# Patient Record
Sex: Male | Born: 1950 | Race: White | Hispanic: No | Marital: Married | Smoking: Former smoker
Health system: Southern US, Community
[De-identification: ages and names within clinical notes are randomized; demographics above are authoritative.]

## PROBLEM LIST (undated history)

## (undated) DIAGNOSIS — F419 Anxiety disorder, unspecified: Secondary | ICD-10-CM

## (undated) DIAGNOSIS — R0600 Dyspnea, unspecified: Secondary | ICD-10-CM

## (undated) DIAGNOSIS — G4700 Insomnia, unspecified: Secondary | ICD-10-CM

## (undated) DIAGNOSIS — I350 Nonrheumatic aortic (valve) stenosis: Secondary | ICD-10-CM

## (undated) DIAGNOSIS — I10 Essential (primary) hypertension: Secondary | ICD-10-CM

## (undated) HISTORY — DX: Essential (primary) hypertension: I10

## (undated) HISTORY — PX: TONSILLECTOMY: SUR1361

---

## 2004-11-18 HISTORY — PX: SUPRAVALVULAR AORTIC STENOSIS REPAIR: SHX2475

## 2014-11-30 ENCOUNTER — Other Ambulatory Visit: Payer: Self-pay | Admitting: Physician Assistant

## 2014-11-30 DIAGNOSIS — I350 Nonrheumatic aortic (valve) stenosis: Secondary | ICD-10-CM

## 2014-12-01 ENCOUNTER — Ambulatory Visit (HOSPITAL_COMMUNITY): Payer: BLUE CROSS/BLUE SHIELD | Attending: Physician Assistant

## 2014-12-01 DIAGNOSIS — I350 Nonrheumatic aortic (valve) stenosis: Secondary | ICD-10-CM | POA: Insufficient documentation

## 2014-12-01 NOTE — Progress Notes (Signed)
2D Echo completed. 12/01/2014

## 2014-12-12 ENCOUNTER — Telehealth: Payer: Self-pay | Admitting: Interventional Cardiology

## 2014-12-12 NOTE — Telephone Encounter (Signed)
Not sure if anyone called him about his echo.  Nl LVEF.  Moderate Aortic stenosis.

## 2014-12-12 NOTE — Telephone Encounter (Signed)
Spoke with pt and informed him of his echo results.  Pt verbalized understanding. Reminded pt of appt on 1/28 and he was aware of this appt.

## 2014-12-15 ENCOUNTER — Encounter: Payer: Self-pay | Admitting: Interventional Cardiology

## 2014-12-15 ENCOUNTER — Ambulatory Visit (INDEPENDENT_AMBULATORY_CARE_PROVIDER_SITE_OTHER): Payer: BLUE CROSS/BLUE SHIELD | Admitting: Interventional Cardiology

## 2014-12-15 VITALS — BP 126/90 | HR 90 | Ht 68.0 in | Wt 163.4 lb

## 2014-12-15 DIAGNOSIS — I350 Nonrheumatic aortic (valve) stenosis: Secondary | ICD-10-CM

## 2014-12-15 DIAGNOSIS — I10 Essential (primary) hypertension: Secondary | ICD-10-CM

## 2014-12-15 DIAGNOSIS — E785 Hyperlipidemia, unspecified: Secondary | ICD-10-CM | POA: Diagnosis not present

## 2014-12-15 MED ORDER — METOPROLOL TARTRATE 25 MG PO TABS: 25.0000 mg | ORAL_TABLET | Freq: Two times a day (BID) | ORAL | Status: DC

## 2014-12-15 MED ORDER — ATORVASTATIN CALCIUM 10 MG PO TABS: 10.0000 mg | ORAL_TABLET | Freq: Every day | ORAL | Status: DC

## 2014-12-15 MED ORDER — AMLODIPINE BESYLATE 10 MG PO TABS: 10.0000 mg | ORAL_TABLET | Freq: Every day | ORAL | Status: DC

## 2014-12-15 NOTE — Patient Instructions (Addendum)
Your physician recommends that you return for lab work in: Late February (CMET, Lipids)  Your physician has requested that you have an echocardiogram in 1 year. Echocardiography is a painless test that uses sound waves to create images of your heart. It provides your doctor with information about the size and shape of your heart and how well your heart's chambers and valves are working. This procedure takes approximately one hour. There are no restrictions for this procedure.    Your physician wants you to follow-up in: 1 year with Dr. Irish Lack. You will receive a reminder letter in the mail two months in advance. If you don't receive a letter, please call our office to schedule the follow-up appointment.

## 2014-12-15 NOTE — Progress Notes (Signed)
Patient ID: Ronald Gomez, male   DOB: [birth year], 64 y.o.   MRN: [id number]     Patient ID: Ronald Gomez MRN: [id number] DOB/AGE: 06-20-1951 64 y.o.   Referring Physician No PCP; self referral   Reason for Consultation  Aortic stenosis  HPI: 64 y/o who has had moderate aortic stenosis.  He was followed in [place] with annual echos.  He would have routine blood work as well.  He has not had his lipids checked.    He continues to exercise on a regular basis. He does notice that at high levels of exertion, he may feel a reduction in exercise capacity. He used to be able to burn 500 cal and 40 minutes on a rowing machine. Now, it takes him about 45 minutes and he definitely feels very tired. He is unsure whether this is just aging or progression of his aortic stenosis. Review of his chart reveals that in 2014, he had a calculated valve area of 1.5 cm. He reports to me that in 2015, his echo showed an aortic valve area of 1.1 cm. The most recent echocardiogram done just a few weeks ago, shows a valve area of 1.1 cm.     Current Outpatient Prescriptions  Medication Sig Dispense Refill  . amLODipine (NORVASC) 10 MG tablet Take 10 mg by mouth daily.    Marland Kitchen aspirin 325 MG tablet Take 325 mg by mouth daily.    Marland Kitchen atorvastatin (LIPITOR) 10 MG tablet Take 10 mg by mouth daily.    . metoprolol tartrate (LOPRESSOR) 25 MG tablet Take 25 mg by mouth 2 (two) times daily.    . Omega-3 Fatty Acids (FISH OIL) 1000 MG CAPS Take 1,000 mg by mouth daily.    . vitamin C (ASCORBIC ACID) 500 MG tablet Take 500 mg by mouth daily.     No current facility-administered medications for this visit.   Past Medical History  Diagnosis Date  . Hypertension     Family History  Problem Relation Age of Onset  . Hypertension Mother   . Heart disease Mother   . CAD Mother   . Cardiomyopathy Mother   . Diabetes Mother   . Brain cancer Father     History   Social History  . Marital Status: Married    Spouse Name: N/A   Number of Children: N/A  . Years of Education: N/A   Occupational History  . Not on file.   Social History Main Topics  . Smoking status: Never Smoker   . Smokeless tobacco: Not on file  . Alcohol Use: 0.0 oz/week    0 Not specified per week     Comment: occasionally   . Drug Use: No  . Sexual Activity: Not on file   Other Topics Concern  . Not on file   Social History Narrative  . No narrative on file    History reviewed. No pertinent past surgical history.    (Not in a hospital admission)  Review of systems complete and found to be negative unless listed above .  No nausea, vomiting.  No fever chills, No focal weakness,  No palpitations.  Physical Exam: Filed Vitals:   12/15/14 1521  BP: 126/90  Pulse: 90    Weight: 163 lb 6.4 oz (74.118 kg)  Physical exam:  [place]/AT EOMI No JVD, No carotid bruit, normal carotid upstroke RRR X2J1 , 3/6 systolic murmur No wheezing Soft. NT, nondistended No edema. No focal motor or sensory deficits Normal affect  Labs:  No results found for: WBC, HGB, HCT, MCV, PLT No results for input(s): NA, K, CL, CO2, BUN, CREATININE, CALCIUM, PROT, BILITOT, ALKPHOS, ALT, AST, GLUCOSE in the last 168 hours.  Invalid input(s): LABALBU No results found for: CKTOTAL, CKMB, CKMBINDEX, TROPONINI No results found for: CHOL No results found for: HDL No results found for: LDLCALC No results found for: TRIG No results found for: CHOLHDL No results found for: LDLDIRECT    EKG: Normal sinus rhythm, nonspecific ST segment changes  ASSESSMENT AND PLAN:   1) aortic stenosis: This appears to be of moderate degree at this time. Plan for echocardiogram in one year. He will watch for any signs of symptomatic severe aortic stenosis.  2) hypertension: Blood pressure well controlled at home. Continue amlodipine. He did not tolerate lisinopril due to a rash and a feeling of perioral paresthesias.  This may have been some variant of angioedema. Could consider  an ARB in the future if needed. Refill blood pressure meds.  3) hyperlipidemia: Continue atorvastatin. Will check labs for fasting lipids, electrolytes and liver function tests. Refill lipid-lowering therapy. Signed:   Mina Marble, MD, [facility] 12/15/2014, 3:58 PM

## 2014-12-20 ENCOUNTER — Ambulatory Visit (AMBULATORY_SURGERY_CENTER): Payer: Self-pay

## 2014-12-20 VITALS — Ht 68.0 in | Wt 163.0 lb

## 2014-12-20 DIAGNOSIS — Z1211 Encounter for screening for malignant neoplasm of colon: Secondary | ICD-10-CM

## 2014-12-20 MED ORDER — MOVIPREP 100 G PO SOLR: 1.0000 | Freq: Once | ORAL | Status: DC

## 2014-12-20 NOTE — Progress Notes (Signed)
No allergies to eggs or soy No home oxygen No diet/weight loss meds No past problems with anesthesia ok'd with cardiologist  Has email  Emmi instructions given for colonoscopy PLEASE GIVE PT AS LITTLE PROPOFOL AS POSSIBLE.

## 2015-01-03 ENCOUNTER — Ambulatory Visit (AMBULATORY_SURGERY_CENTER): Payer: BLUE CROSS/BLUE SHIELD | Admitting: Gastroenterology

## 2015-01-03 ENCOUNTER — Encounter: Payer: Self-pay | Admitting: Gastroenterology

## 2015-01-03 VITALS — BP 136/88 | HR 65 | Temp 97.8°F | Resp 23 | Ht 68.0 in | Wt 163.0 lb

## 2015-01-03 DIAGNOSIS — D125 Benign neoplasm of sigmoid colon: Secondary | ICD-10-CM

## 2015-01-03 DIAGNOSIS — Z1211 Encounter for screening for malignant neoplasm of colon: Secondary | ICD-10-CM | POA: Diagnosis present

## 2015-01-03 MED ORDER — SODIUM CHLORIDE 0.9 % IV SOLN: 500.0000 mL | INTRAVENOUS | Status: DC

## 2015-01-03 NOTE — Progress Notes (Signed)
Called to room to assist during endoscopic procedure.  Patient ID and intended procedure confirmed with present staff. Received instructions for my participation in the procedure from the performing physician.  

## 2015-01-03 NOTE — Op Note (Signed)
[place]  [address] [place], [address]   COLONOSCOPY PROCEDURE REPORT  PATIENT: Ronald Gomez, Ronald Gomez  MR#: [id number] BIRTHDATE: 04-Feb-1951 , 63  yrs. old GENDER: male ENDOSCOPIST: Milus Banister, MD PROCEDURE DATE:  01/03/2015 PROCEDURE:   Colonoscopy with snare polypectomy First Screening Colonoscopy - Avg.  risk and is 50 yrs.  old or older Yes.  Prior Negative Screening - Now for repeat screening. N/A  History of Adenoma - Now for follow-up colonoscopy & has been > or = to 3 yrs.  N/A  Polyps Removed Today? Yes. ASA CLASS:   Class II INDICATIONS:average risk patient for colon cancer. MEDICATIONS: Monitored anesthesia care and Propofol 150 mg IV  DESCRIPTION OF PROCEDURE:   After the risks benefits and alternatives of the procedure were thoroughly explained, informed consent was obtained.  The digital rectal exam revealed no abnormalities of the rectum.   The LB [id number]  endoscope was introduced through the anus and advanced to the cecum, which was identified by both the appendix and ileocecal valve. No adverse events experienced.   The quality of the prep was excellent.  The instrument was then slowly withdrawn as the colon was fully examined.   COLON FINDINGS: One polyp was found, removed and sent to pathology. This was 2-80mm, sessile, located in sigmoid segment, removed with cold snare.  There were multiple diverticulum in left colon (descending/sigmoid).  There were small external hemorrhoids.  The examination was otherwise normal.  Retroflexed views revealed no abnormalities. The time to cecum=7 minutes 03 seconds.  Withdrawal time=8 minutes 22 seconds.  The scope was withdrawn and the procedure completed. COMPLICATIONS: There were no immediate complications.  ENDOSCOPIC IMPRESSION: One polyp was found, removed and sent to pathology. There were multiple diverticulum in left colon (descending/sigmoid).  Small external hemorrhoids. The  examination was otherwise normal  RECOMMENDATIONS: If the polyp(s) removed today are proven to be adenomatous (pre-cancerous) polyps, you will need a repeat colonoscopy in 5 years.  Otherwise you should continue to follow colorectal cancer screening guidelines for "routine risk" patients with colonoscopy in 10 years.  You will receive a letter within 1-2 weeks with the results of your biopsy as well as final recommendations.  Please call my office if you have not received a letter after 3 weeks.  eSigned:  Milus Banister, MD 01/03/2015 11:22 AM

## 2015-01-03 NOTE — Patient Instructions (Signed)
YOU HAD AN ENDOSCOPIC PROCEDURE TODAY AT THE [facility]: Refer to the procedure report that was given to you for any specific questions about what was found during the examination.  If the procedure report does not answer your questions, please call your gastroenterologist to clarify.  If you requested that your care partner not be given the details of your procedure findings, then the procedure report has been included in a sealed envelope for you to review at your convenience later.  YOU SHOULD EXPECT: Some feelings of bloating in the abdomen. Passage of more gas than usual.  Walking can help get rid of the air that was put into your GI tract during the procedure and reduce the bloating. If you had a lower endoscopy (such as a colonoscopy or flexible sigmoidoscopy) you may notice spotting of blood in your stool or on the toilet paper. If you underwent a bowel prep for your procedure, then you may not have a normal bowel movement for a few days.  DIET: Your first meal following the procedure should be a light meal and then it is ok to progress to your normal diet.  A half-sandwich or bowl of soup is an example of a good first meal.  Heavy or fried foods are harder to digest and may make you feel nauseous or bloated.  Likewise meals heavy in dairy and vegetables can cause extra gas to form and this can also increase the bloating.  Drink plenty of fluids but you should avoid alcoholic beverages for 24 hours.  ACTIVITY: Your care partner should take you home directly after the procedure.  You should plan to take it easy, moving slowly for the rest of the day.  You can resume normal activity the day after the procedure however you should NOT DRIVE or use heavy machinery for 24 hours (because of the sedation medicines used during the test).    SYMPTOMS TO REPORT IMMEDIATELY: A gastroenterologist can be reached at any hour.  During normal business hours, 8:30 AM to 5:00 PM Monday through Friday,  call [phone].  After hours and on weekends, please call the GI answering service at [phone] who will take a message and have the physician on call contact you.   Following lower endoscopy (colonoscopy or flexible sigmoidoscopy):  Excessive amounts of blood in the stool  Significant tenderness or worsening of abdominal pains  Swelling of the abdomen that is new, acute  Fever of 100F or higher  FOLLOW UP: If any biopsies were taken you will be contacted by phone or by letter within the next 1-3 weeks.  Call your gastroenterologist if you have not heard about the biopsies in 3 weeks.  Our staff will call the home number listed on your records the next business day following your procedure to check on you and address any questions or concerns that you may have at that time regarding the information given to you following your procedure. This is a courtesy call and so if there is no answer at the home number and we have not heard from you through the emergency physician on call, we will assume that you have returned to your regular daily activities without incident.  SIGNATURES/CONFIDENTIALITY: You and/or your care partner have signed paperwork which will be entered into your electronic medical record.  These signatures attest to the fact that that the information above on your After Visit Summary has been reviewed and is understood.  Full responsibility of the confidentiality of this   discharge information lies with you and/or your care-partner.  Recommendations Next colonoscopy determined by pathology results; 5 or 10 years. Polyp, diverticulosis, and high fiber diet handouts provided. Discharge instructions provided to patient and/or care partner.

## 2015-01-03 NOTE — Progress Notes (Signed)
Patient awakening,vss,report to rn 

## 2015-01-04 ENCOUNTER — Telehealth: Payer: Self-pay | Admitting: *Deleted

## 2015-01-04 NOTE — Telephone Encounter (Signed)
No answer. Name identifier. Message left to call if questions or concerns. 

## 2015-01-10 ENCOUNTER — Encounter: Payer: Self-pay | Admitting: Gastroenterology

## 2015-01-13 ENCOUNTER — Other Ambulatory Visit (INDEPENDENT_AMBULATORY_CARE_PROVIDER_SITE_OTHER): Payer: BLUE CROSS/BLUE SHIELD | Admitting: *Deleted

## 2015-01-13 DIAGNOSIS — I350 Nonrheumatic aortic (valve) stenosis: Secondary | ICD-10-CM | POA: Diagnosis not present

## 2015-01-13 LAB — COMPREHENSIVE METABOLIC PANEL
ALK PHOS: 53 U/L (ref 39–117)
ALT: 32 U/L (ref 0–53)
AST: 21 U/L (ref 0–37)
Albumin: 4.5 g/dL (ref 3.5–5.2)
BILIRUBIN TOTAL: 0.5 mg/dL (ref 0.2–1.2)
BUN: 17 mg/dL (ref 6–23)
CO2: 30 mEq/L (ref 19–32)
CREATININE: 1.01 mg/dL (ref 0.40–1.50)
Calcium: 9.1 mg/dL (ref 8.4–10.5)
Chloride: 103 mEq/L (ref 96–112)
GFR: 79.62 mL/min (ref >60.00)
Glucose, Bld: 116 mg/dL — ABNORMAL HIGH (ref 70–99)
Potassium: 4.1 mEq/L (ref 3.5–5.1)
Sodium: 137 mEq/L (ref 135–145)
Total Protein: 6.7 g/dL (ref 6.0–8.3)

## 2015-01-13 LAB — LIPID PANEL
CHOLESTEROL: 121 mg/dL (ref 0–200)
HDL: 43.6 mg/dL (ref >39.00)
LDL Cholesterol: 61 mg/dL (ref 0–99)
NonHDL: 77.4
TRIGLYCERIDES: 81 mg/dL (ref 0.0–149.0)
Total CHOL/HDL Ratio: 3
VLDL: 16.2 mg/dL (ref 0.0–40.0)

## 2015-01-17 ENCOUNTER — Telehealth: Payer: Self-pay | Admitting: Interventional Cardiology

## 2015-01-17 NOTE — Telephone Encounter (Signed)
New Msg        Pt returning call from today.    Please return call.

## 2015-01-17 NOTE — Telephone Encounter (Signed)
Called patient back about his lab results. Informed patient of Dr. Hassell Done note below. Patient verbalized understanding.  Notes Recorded by Jettie Booze, MD on 01/13/2015 at 9:49 PM Lipids , liver and kidney function well controlled. Mildly elevated glucose-prediabetes. Please forward to PCP.

## 2015-06-09 ENCOUNTER — Other Ambulatory Visit: Payer: Self-pay | Admitting: Interventional Cardiology

## 2015-11-10 ENCOUNTER — Other Ambulatory Visit: Payer: Self-pay | Admitting: Interventional Cardiology

## 2015-12-08 ENCOUNTER — Telehealth: Payer: Self-pay | Admitting: Interventional Cardiology

## 2015-12-08 DIAGNOSIS — E785 Hyperlipidemia, unspecified: Secondary | ICD-10-CM

## 2015-12-08 NOTE — Telephone Encounter (Signed)
Pt wants to know if Dr. Irish Lack would like blood work for next visit 3-7--17 pt did have doctors day lab work done in March and was normal but he wanted to check-would prefer a text on his cell letting him know

## 2015-12-08 NOTE — Telephone Encounter (Signed)
**Note De-Identified Ronald Gomez Obfuscation** I have sent the pt a detailed text asking him if he would like to have his Lipids and CMET drawn on the same day as his Echo which is scheduled on 12/20/15. I have asked him to respond by text.

## 2015-12-08 NOTE — Telephone Encounter (Signed)
The pt responded to my text. He wants to have his lab work done at his f/u with Dr Irish Lack on 01/23/16. He is advised that would be ok and to come to that visit NPO. He verbalized understanding and is in agreement with plan.  CMET and Lipids have been ordered and scheduled to be drawn on 01/23/16.

## 2015-12-20 ENCOUNTER — Other Ambulatory Visit: Payer: Self-pay | Admitting: Interventional Cardiology

## 2015-12-20 ENCOUNTER — Ambulatory Visit (HOSPITAL_COMMUNITY): Payer: BLUE CROSS/BLUE SHIELD | Attending: Cardiovascular Disease

## 2015-12-20 ENCOUNTER — Other Ambulatory Visit: Payer: Self-pay

## 2015-12-20 DIAGNOSIS — I350 Nonrheumatic aortic (valve) stenosis: Secondary | ICD-10-CM | POA: Diagnosis not present

## 2015-12-20 DIAGNOSIS — I517 Cardiomegaly: Secondary | ICD-10-CM | POA: Insufficient documentation

## 2015-12-20 DIAGNOSIS — I359 Nonrheumatic aortic valve disorder, unspecified: Secondary | ICD-10-CM | POA: Diagnosis present

## 2015-12-20 DIAGNOSIS — I352 Nonrheumatic aortic (valve) stenosis with insufficiency: Secondary | ICD-10-CM | POA: Insufficient documentation

## 2015-12-21 ENCOUNTER — Telehealth: Payer: Self-pay | Admitting: Interventional Cardiology

## 2015-12-21 DIAGNOSIS — R0602 Shortness of breath: Secondary | ICD-10-CM

## 2015-12-21 NOTE — Telephone Encounter (Signed)
New Message:  Pt called in requesting to speak with the doctor because he says is more symptomatic and is concerned. He is having more SOB with exertion. Please f/u with him

## 2015-12-21 NOTE — Telephone Encounter (Signed)
Let's schedule an exercise treadmill test. Monday 2/6 would be a good day.  Thursday the 9th would be good also.  If we can do it on a day when I am in the office to supervise the treadmill, that would be ideal.  You can call or text him.    Jettie Booze, MD

## 2015-12-22 NOTE — Telephone Encounter (Signed)
The pt is advised that someone from the office will be calling to arrange time of test on Monday 2/6. He verbalized understanding. GXT ordered and a message has been sent to [facility] to call the pt to arrange time of test.

## 2015-12-25 ENCOUNTER — Ambulatory Visit (HOSPITAL_COMMUNITY)
Admission: RE | Admit: 2015-12-25 | Discharge: 2015-12-25 | Disposition: A | Discharge status: Home / Self Care | Payer: BLUE CROSS/BLUE SHIELD | Source: Ambulatory Visit | Attending: Interventional Cardiology | Admitting: Interventional Cardiology

## 2015-12-25 ENCOUNTER — Encounter (HOSPITAL_COMMUNITY)
Admission: RE | Disposition: A | Discharge status: Home / Self Care | Payer: Self-pay | Source: Ambulatory Visit | Attending: Interventional Cardiology

## 2015-12-25 DIAGNOSIS — I10 Essential (primary) hypertension: Secondary | ICD-10-CM | POA: Diagnosis present

## 2015-12-25 DIAGNOSIS — R9439 Abnormal result of other cardiovascular function study: Secondary | ICD-10-CM | POA: Diagnosis not present

## 2015-12-25 DIAGNOSIS — I350 Nonrheumatic aortic (valve) stenosis: Secondary | ICD-10-CM

## 2015-12-25 DIAGNOSIS — E785 Hyperlipidemia, unspecified: Secondary | ICD-10-CM | POA: Diagnosis not present

## 2015-12-25 DIAGNOSIS — Z8249 Family history of ischemic heart disease and other diseases of the circulatory system: Secondary | ICD-10-CM | POA: Diagnosis not present

## 2015-12-25 DIAGNOSIS — I2510 Atherosclerotic heart disease of native coronary artery without angina pectoris: Secondary | ICD-10-CM | POA: Insufficient documentation

## 2015-12-25 DIAGNOSIS — R0602 Shortness of breath: Secondary | ICD-10-CM

## 2015-12-25 DIAGNOSIS — Z7982 Long term (current) use of aspirin: Secondary | ICD-10-CM | POA: Insufficient documentation

## 2015-12-25 HISTORY — PX: CARDIAC CATHETERIZATION: SHX172

## 2015-12-25 LAB — POCT I-STAT 3, ART BLOOD GAS (G3+)
ACID-BASE EXCESS: 1 mmol/L (ref 0.0–2.0)
BICARBONATE: 25.3 meq/L — AB (ref 20.0–24.0)
O2 SAT: 97 %
PO2 ART: 89 mmHg (ref 80.0–100.0)
TCO2: 26 mmol/L (ref 0–100)
pCO2 arterial: 39.6 mmHg (ref 35.0–45.0)
pH, Arterial: 7.413 (ref 7.350–7.450)

## 2015-12-25 LAB — BASIC METABOLIC PANEL
Anion gap: 12 (ref 5–15)
BUN: 10 mg/dL (ref 6–20)
CHLORIDE: 103 mmol/L (ref 101–111)
CO2: 27 mmol/L (ref 22–32)
Calcium: 9.6 mg/dL (ref 8.9–10.3)
Creatinine, Ser: 1.05 mg/dL (ref 0.61–1.24)
Glucose, Bld: 125 mg/dL — ABNORMAL HIGH (ref 65–99)
POTASSIUM: 4.7 mmol/L (ref 3.5–5.1)
SODIUM: 142 mmol/L (ref 135–145)

## 2015-12-25 LAB — CBC
HEMATOCRIT: 40 % (ref 39.0–52.0)
HEMOGLOBIN: 14.7 g/dL (ref 13.0–17.0)
MCH: 32.9 pg (ref 26.0–34.0)
MCHC: 36.8 g/dL — ABNORMAL HIGH (ref 30.0–36.0)
MCV: 89.5 fL (ref 78.0–100.0)
Platelets: 178 10*3/uL (ref 150–400)
RBC: 4.47 MIL/uL (ref 4.22–5.81)
RDW: 12.3 % (ref 11.5–15.5)
WBC: 5 10*3/uL (ref 4.0–10.5)

## 2015-12-25 LAB — POCT I-STAT 3, VENOUS BLOOD GAS (G3P V)
Acid-Base Excess: 1 mmol/L (ref 0.0–2.0)
BICARBONATE: 25.9 meq/L — AB (ref 20.0–24.0)
O2 SAT: 74 %
PCO2 VEN: 41.1 mmHg — AB (ref 45.0–50.0)
PO2 VEN: 39 mmHg (ref 30.0–45.0)
TCO2: 27 mmol/L (ref 0–100)
pH, Ven: 7.407 — ABNORMAL HIGH (ref 7.250–7.300)

## 2015-12-25 LAB — PROTIME-INR
INR: 0.96 (ref 0.00–1.49)
Prothrombin Time: 13 seconds (ref 11.6–15.2)

## 2015-12-25 SURGERY — RIGHT/LEFT HEART CATH AND CORONARY ANGIOGRAPHY

## 2015-12-25 MED ORDER — SODIUM CHLORIDE 0.9% FLUSH: 3.0000 mL | Freq: Two times a day (BID) | INTRAVENOUS | Status: DC

## 2015-12-25 MED ORDER — HEPARIN SODIUM (PORCINE) 1000 UNIT/ML IJ SOLN
INTRAMUSCULAR | Status: AC
  Filled 2015-12-25: qty 1

## 2015-12-25 MED ORDER — ASPIRIN 81 MG PO CHEW: 81.0000 mg | CHEWABLE_TABLET | ORAL | Status: DC

## 2015-12-25 MED ORDER — SODIUM CHLORIDE 0.9 % WEIGHT BASED INFUSION: 1.0000 mL/kg/h | INTRAVENOUS | Status: DC

## 2015-12-25 MED ORDER — FENTANYL CITRATE (PF) 100 MCG/2ML IJ SOLN
INTRAMUSCULAR | Status: AC
  Filled 2015-12-25: qty 2

## 2015-12-25 MED ORDER — VERAPAMIL HCL 2.5 MG/ML IV SOLN
INTRAVENOUS | Status: DC | PRN
  Administered 2015-12-25: 10 mL via INTRA_ARTERIAL

## 2015-12-25 MED ORDER — MIDAZOLAM HCL 2 MG/2ML IJ SOLN
INTRAMUSCULAR | Status: AC
  Filled 2015-12-25: qty 2

## 2015-12-25 MED ORDER — SODIUM CHLORIDE 0.9% FLUSH: 3.0000 mL | INTRAVENOUS | Status: DC | PRN

## 2015-12-25 MED ORDER — HEPARIN (PORCINE) IN NACL 2-0.9 UNIT/ML-% IJ SOLN
INTRAMUSCULAR | Status: DC | PRN
  Administered 2015-12-25: 1000 mL

## 2015-12-25 MED ORDER — FENTANYL CITRATE (PF) 100 MCG/2ML IJ SOLN
INTRAMUSCULAR | Status: DC | PRN
  Administered 2015-12-25: 25 ug via INTRAVENOUS

## 2015-12-25 MED ORDER — MIDAZOLAM HCL 2 MG/2ML IJ SOLN
INTRAMUSCULAR | Status: DC | PRN
  Administered 2015-12-25: 2 mg via INTRAVENOUS

## 2015-12-25 MED ORDER — HEPARIN SODIUM (PORCINE) 1000 UNIT/ML IJ SOLN
INTRAMUSCULAR | Status: DC | PRN
  Administered 2015-12-25: 4000 [IU] via INTRAVENOUS

## 2015-12-25 MED ORDER — LIDOCAINE HCL (PF) 1 % IJ SOLN
INTRAMUSCULAR | Status: DC | PRN
  Administered 2015-12-25: 5 mL via INTRADERMAL
  Administered 2015-12-25: 10 mL via INTRADERMAL

## 2015-12-25 MED ORDER — HEPARIN (PORCINE) IN NACL 2-0.9 UNIT/ML-% IJ SOLN
INTRAMUSCULAR | Status: AC
  Filled 2015-12-25: qty 1000

## 2015-12-25 MED ORDER — NITROGLYCERIN 1 MG/10 ML FOR IR/CATH LAB
INTRA_ARTERIAL | Status: AC
  Filled 2015-12-25: qty 10

## 2015-12-25 MED ORDER — VERAPAMIL HCL 2.5 MG/ML IV SOLN
INTRAVENOUS | Status: AC
  Filled 2015-12-25: qty 2

## 2015-12-25 MED ORDER — SODIUM CHLORIDE 0.9 % IV SOLN: 250.0000 mL | INTRAVENOUS | Status: DC | PRN

## 2015-12-25 MED ORDER — LIDOCAINE HCL (PF) 1 % IJ SOLN
INTRAMUSCULAR | Status: AC
  Filled 2015-12-25: qty 30

## 2015-12-25 MED ORDER — SODIUM CHLORIDE 0.9 % WEIGHT BASED INFUSION: 3.0000 mL/kg/h | INTRAVENOUS | Status: DC

## 2015-12-25 SURGICAL SUPPLY — 20 items
CATH BALLN WEDGE 5F 110CM (CATHETERS) ×3 IMPLANT
CATH INFINITI 4FR 145 PIGTAIL (CATHETERS) ×3 IMPLANT
CATH INFINITI 5 FR JL3.5 (CATHETERS) ×3 IMPLANT
CATH INFINITI 5FR ANG PIGTAIL (CATHETERS) ×3 IMPLANT
CATH INFINITI JR4 5F (CATHETERS) ×3 IMPLANT
DEVICE RAD COMP TR BAND LRG (VASCULAR PRODUCTS) ×3 IMPLANT
GLIDESHEATH SLEND SS 6F .021 (SHEATH) ×3 IMPLANT
GUIDEWIRE .025 260CM (WIRE) ×3 IMPLANT
KIT HEART LEFT (KITS) ×3 IMPLANT
NEEDLE PERC 21GX4CM (NEEDLE) ×3 IMPLANT
NEEDLE PERC ENTRY 21G 2.5CM (NEEDLE) ×3 IMPLANT
PACK CARDIAC CATHETERIZATION (CUSTOM PROCEDURE TRAY) ×3 IMPLANT
SHEATH FAST CATH BRACH 5F 5CM (SHEATH) ×3 IMPLANT
SYR MEDRAD MARK V 150ML (SYRINGE) ×3 IMPLANT
TRANSDUCER W/STOPCOCK (MISCELLANEOUS) ×6 IMPLANT
TUBING ART PRESS 72  MALE/FEM (TUBING) ×2
TUBING ART PRESS 72 MALE/FEM (TUBING) ×1 IMPLANT
TUBING CIL FLEX 10 FLL-RA (TUBING) ×3 IMPLANT
WIRE HI TORQ VERSACORE-J 145CM (WIRE) ×3 IMPLANT
WIRE SAFE-T 1.5MM-J .035X260CM (WIRE) ×3 IMPLANT

## 2015-12-25 NOTE — Interval H&P Note (Signed)
Cath Lab Visit (complete for each Cath Lab visit)  Clinical Evaluation Leading to the Procedure:   ACS: No.  Non-ACS:    Anginal Classification: CCS II  Anti-ischemic medical therapy: Minimal Therapy (1 class of medications)  Non-Invasive Test Results: High-risk stress test findings: cardiac mortality >3%/year  Prior CABG: No previous CABG      History and Physical Interval Note:  12/25/2015 3:39 PM  Phebe Colla  has presented today for surgery, with the diagnosis of cp  The various methods of treatment have been discussed with the patient and family. After consideration of risks, benefits and other options for treatment, the patient has consented to  Procedure(s): Right/Left Heart Cath and Coronary Angiography (N/A) as a surgical intervention .  The patient's history has been reviewed, patient examined, no change in status, stable for surgery.  I have reviewed the patient's chart and labs.  Questions were answered to the patient's satisfaction.     Aliyyah Riese S.

## 2015-12-25 NOTE — Discharge Instructions (Signed)
Radial Site Care °Refer to this sheet in the next few weeks. These instructions provide you with information about caring for yourself after your procedure. Your health care provider may also give you more specific instructions. Your treatment has been planned according to current medical practices, but problems sometimes occur. Call your health care provider if you have any problems or questions after your procedure. °WHAT TO EXPECT AFTER THE PROCEDURE °After your procedure, it is typical to have the following: °· Bruising at the radial site that usually fades within 1-2 weeks. °· Blood collecting in the tissue (hematoma) that may be painful to the touch. It should usually decrease in size and tenderness within 1-2 weeks. °HOME CARE INSTRUCTIONS °· Take medicines only as directed by your health care provider. °· You may shower 24-48 hours after the procedure or as directed by your health care provider. Remove the bandage (dressing) and gently wash the site with plain soap and water. Pat the area dry with a clean towel. Do not rub the site, because this may cause bleeding. °· Do not take baths, swim, or use a hot tub until your health care provider approves. °· Check your insertion site every day for redness, swelling, or drainage. °· Do not apply powder or lotion to the site. °· Do not flex or bend the affected arm for 24 hours or as directed by your health care provider. °· Do not push or pull heavy objects with the affected arm for 24 hours or as directed by your health care provider. °· Do not lift over 10 lb (4.5 kg) for 5 days after your procedure or as directed by your health care provider. °· Ask your health care provider when it is okay to: °¨ Return to work or school. °¨ Resume usual physical activities or sports. °¨ Resume sexual activity. °· Do not drive home if you are discharged the same day as the procedure. Have someone else drive you. °· You may drive 24 hours after the procedure unless otherwise  instructed by your health care provider. °· Do not operate machinery or power tools for 24 hours after the procedure. °· If your procedure was done as an outpatient procedure, which means that you went home the same day as your procedure, a responsible adult should be with you for the first 24 hours after you arrive home. °· Keep all follow-up visits as directed by your health care provider. This is important. °SEEK MEDICAL CARE IF: °· You have a fever. °· You have chills. °· You have increased bleeding from the radial site. Hold pressure on the site. °SEEK IMMEDIATE MEDICAL CARE IF: °· You have unusual pain at the radial site. °· You have redness, warmth, or swelling at the radial site. °· You have drainage (other than a small amount of blood on the dressing) from the radial site. °· The radial site is bleeding, and the bleeding does not stop after 30 minutes of holding steady pressure on the site. °· Your arm or hand becomes pale, cool, tingly, or numb. °  °This information is not intended to replace advice given to you by your health care provider. Make sure you discuss any questions you have with your health care provider. °  °Document Released: 12/07/2010 Document Revised: 11/25/2014 Document Reviewed: 05/23/2014 °[employer]vier Interactive Patient Education ©2016 [employer] ° °

## 2015-12-25 NOTE — H&P (Signed)
CARDIOLOGY H&P NOTE   Patient ID: Ronald Gomez MRN: [id number] DOB/AGE: 07/12/1951 65 y.o.   Primary Physician   [name], MD Primary Cardiologist  Dr. Irish Lack  HPI: Ronald Gomez is a 65 y.o. male with a history of HTN, HLD and aortic stenosis (possible bicuspid AV) who presented to [facility] today for an elective GXT.   He recently moved here from [place] to be closer to family. 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 He has a long history of aortic stenosis followed by annual ECHOs. Review of his chart reveals that in 2014, he had a calculated arotic valve area of 1.5 cm. In 2015, his echo showed an aortic valve area of 1.1 cm and the most recent echocardiogram done in Jan 2017, showed a valve area of 1.1 cm. He established care with Dr. Irish Lack and was last seen on 12/16/15. He complained for exertional SOB and decreased exercise tolerance. He denied chest pain.  He was set up for an outpatient GXT, which I performed today in the [facility]. He walked 10 min and 22 sec on Bruce protocol. He had no chest pain. He had significant horizonal ST depression in inferior and lateral leads.   Past Medical History  Diagnosis Date  . Hypertension      Past Surgical History  Procedure Laterality Date  . Supravalvular aortic stenosis repair N/A 2006    Allergies  Allergen Reactions  . Lisinopril     Rash and lip tingling    I have reviewed the patient's current medications     Prior to Admission medications   Medication Sig Start Date End Date Taking? Authorizing Provider  amLODipine (NORVASC) 10 MG tablet TAKE 1 TABLET (10 MG TOTAL) BY MOUTH DAILY. 11/10/15   Jettie Booze, MD  aspirin 325 MG tablet Take 325 mg by mouth daily.    Historical Provider, MD  atorvastatin (LIPITOR) 10 MG tablet TAKE 1 TABLET BY MOUTH EVERY DAY 11/10/15   Jettie Booze, MD  metoprolol tartrate (LOPRESSOR) 25 MG tablet TAKE 1 TABLET (25 MG TOTAL) BY  MOUTH 2 (TWO) TIMES DAILY. 11/10/15   Jettie Booze, MD  Omega-3 Fatty Acids (FISH OIL) 1000 MG CAPS Take 1,000 mg by mouth daily.    Historical Provider, MD  vitamin C (ASCORBIC ACID) 500 MG tablet Take 500 mg by mouth daily.    Historical Provider, MD     Social History   Social History  . Marital Status: Married    Spouse Name: N/A  . Number of Children: N/A  . Years of Education: N/A   Occupational History  . Not on file.   Social History Main Topics  . Smoking status: Never Smoker   . Smokeless tobacco: Never Used  . Alcohol Use: 0.0 oz/week    0 Standard drinks or equivalent per week     Comment: occasionally   . Drug Use: No  . Sexual Activity: Not on file   Other Topics Concern  . Not on file   Social History Narrative    Family Status  Relation Status Death Age  . Mother Deceased   . Father Deceased    Family History  Problem Relation Age of Onset  . Hypertension Mother   . Heart disease Mother   . CAD Mother   . Cardiomyopathy Mother   . Diabetes Mother   . Brain cancer Father   . Colon cancer Neg Hx  ROS:  Full 14 point review of systems complete and found to be negative unless listed above.  Physical Exam: There were no vitals taken for this visit.  General: Well developed, well nourished, male in no acute distress Head: Eyes PERRLA, No xanthomas.   Normocephalic and atraumatic, oropharynx without edema or exudate. Dentition:  Lungs: CTAB Heart: HRRR S1 S2, no rub/gallop, Heart regular rate and rhythm with S1, S2 + SEM @ RUSB. pulses are 2+ extrem.   Neck: No carotid bruits. No lymphadenopathy. No JVD. Abdomen: Bowel sounds present, abdomen soft and non-tender without masses or hernias noted. Msk:  No spine or cva tenderness. No weakness, no joint deformities or effusions. Extremities: No clubbing or cyanosis. No edema.  Neuro: Alert and oriented X 3. No focal deficits noted. Psych:  Good affect, responds appropriately Skin: No rashes  or lesions noted.  Labs:  No results found for: WBC, HGB, HCT, MCV, PLT No results for input(s): INR in the last 72 hours.  Lab Results  Component Value Date   CHOL 121 01/13/2015   HDL 43.60 01/13/2015   LDLCALC 61 01/13/2015   TRIG 81.0 01/13/2015    Echo: 12/20/2015 LV EF: 55% -  60% Study Conclusions - Left ventricle: The cavity size was mildly dilated. Wall thickness was increased in a pattern of mild LVH. Systolic function was normal. The estimated ejection fraction was in the range of 55% to 60%. - Aortic valve: Mean gradient has increased from 21 mmHg to 35 mmHg since last echo Cannot r/o bicuspid morphology. There was moderate to severe stenosis. There was trivial regurgitation. - Left atrium: The atrium was mildly dilated. - Right atrium: The atrium was mildly dilated. - Atrial septum: No defect or patent foramen ovale was identified.   Radiology:  No results found.  ASSESSMENT AND PLAN:    Principal Problem:   Abnormal stress test Active Problems:   Aortic stenosis   Essential hypertension   Hyperlipidemia  Ronald Gomez is a 65 y.o. male with a history of HTN, HLD and aortic stenosis (possible bicuspid AV) who presented to [facility] today for an elective GXT.   Abnormal GXT: significant horizonal ST depression in inferior and lateral leads. With his hx of mod-severe AS, exertional SOB, decreased exercise tolerance, we will proceed with [facility] today with Dr. Irish Lack.   SignedEileen Stanford, PA-C 12/25/2015 11:12 AM  Pager [phone]  Co-Sign MD  I have examined the patient and reviewed assessment and plan and discussed with patient.  Agree with above as stated.  Plan for cardiac cath to evaluate AS and for CAD due to abnormal stress test.  We discussed w/u of his AS a few days ago.  He has already spoken with Dr. Cyndia Bent.   Ronald Gomez S.

## 2015-12-26 ENCOUNTER — Encounter (HOSPITAL_COMMUNITY): Payer: Self-pay | Admitting: Interventional Cardiology

## 2015-12-26 MED FILL — Nitroglycerin IV Soln 100 MCG/ML in D5W: INTRA_ARTERIAL | Qty: 10 | Status: AC

## 2016-01-22 NOTE — Progress Notes (Signed)
Patient ID: Ronald Gomez, male   DOB: 08-15-1951, 65 y.o.   MRN: [id number]     Cardiology Office Note   Date:  01/23/2016   ID:  Ronald Gomez, DOB 1951-08-12, MRN [id number]  PCP:  [name], MD    No chief complaint on file. f/u Aortic stenosis   Wt Readings from Last 3 Encounters:  01/23/16 161 lb 6.4 oz (73.211 kg)  12/25/15 159 lb (72.122 kg)  01/03/15 163 lb (73.936 kg)       History of Present Illness: Ronald Gomez is a 65 y.o. male  who has had moderate aortic stenosis. He was followed in [place] with annual echos. He would have routine blood work as well. He has not had his lipids checked.   He continues to exercise on a regular basis. He does notice that at high levels of exertion, he may feel a reduction in exercise capacity. He used to be able to burn 500 cal and 40 minutes on a rowing machine. Now, it takes him about 45 minutes and he definitely feels very tired. He is unsure whether this is just aging or progression of his aortic stenosis.  The aortic stenosis appeared to progress by echo.  He underwent cath confirming moderate AS, valve area 1.2 cm2.  He has a lot of anxiety with his aortic stenosis. He is very sensitive to any potential changes in his exercise tolerance. He admits that it is sometimes difficult for him to tell whether it's anxiety or true physical symptoms that he is feeling. He is planning a trip to [place] in May.    Past Medical History  Diagnosis Date  . Hypertension     Past Surgical History  Procedure Laterality Date  . Supravalvular aortic stenosis repair N/A 2006  . Cardiac catheterization N/A 12/25/2015    Procedure: Right/Left Heart Cath and Coronary Angiography;  Surgeon: Jettie Booze, MD;  Location: [facility];  Service: Cardiovascular;  Laterality: N/A;     Current Outpatient Prescriptions  Medication Sig Dispense Refill  . amLODipine (NORVASC) 10 MG tablet TAKE 1 TABLET (10 MG TOTAL) BY MOUTH DAILY. 90 tablet 0    . aspirin 81 MG tablet Take 81 mg by mouth daily.    Marland Kitchen atorvastatin (LIPITOR) 10 MG tablet TAKE 1 TABLET BY MOUTH EVERY DAY 90 tablet 0  . metoprolol succinate (TOPROL-XL) 25 MG 24 hr tablet Take 25 mg by mouth 3 (three) times daily.    . Omega-3 Fatty Acids (FISH OIL) 1000 MG CAPS Take 1,000 mg by mouth daily.    . vitamin C (ASCORBIC ACID) 500 MG tablet Take 500 mg by mouth daily.     No current facility-administered medications for this visit.    Allergies:   Lisinopril    Social History:  The patient  reports that he has never smoked. He has never used smokeless tobacco. He reports that he drinks alcohol. He reports that he does not use illicit drugs.   Family History:  The patient's family history includes Brain cancer in his father; CAD in his mother; Cardiomyopathy in his mother; Diabetes in his mother; Heart disease in his mother; Hypertension in his mother. There is no history of Colon cancer, Heart attack, or Stroke.    ROS:  Please see the history of present illness.   Otherwise, review of systems are positive for anxiety.   All other systems are reviewed and negative.    PHYSICAL EXAM: VS:  BP 128/88 mmHg  Pulse  56  Ht 5\' 8"  (1.727 m)  Wt 161 lb 6.4 oz (73.211 kg)  BMI 24.55 kg/m2 , BMI Body mass index is 24.55 kg/(m^2). GEN: Well nourished, well developed, in no acute distress HEENT: normal Neck: no JVD, + carotid bruits-likely radiation of aortic valve murmur., no masses Cardiac: RRR; 3 out of 6 harsh systolic murmurs, no rubs, or gallops,no edema , 2+ right radial pulse Respiratory:  clear to auscultation bilaterally, normal work of breathing GI: soft, nontender, nondistended, + BS MS: no deformity or atrophy Skin: warm and dry, no rash Neuro:  Strength and sensation are intact Psych: euthymic mood, full affect   EKG:   The ekg ordered today demonstrates sinus bradycardia, nonspecific ST segment changes   Recent Labs: 12/25/2015: BUN 10; Creatinine, Ser 1.05;  Hemoglobin 14.7; Platelets 178; Potassium 4.7; Sodium 142   Lipid Panel    Component Value Date/Time   CHOL 121 01/13/2015 0736   TRIG 81.0 01/13/2015 0736   HDL 43.60 01/13/2015 0736   CHOLHDL 3 01/13/2015 0736   VLDL 16.2 01/13/2015 0736   LDLCALC 61 01/13/2015 0736     Other studies Reviewed: Additional studies/ records that were reviewed today with results demonstrating: cath results reviewed.   ASSESSMENT AND PLAN:  1. Aortic stenosis: Moderate AS at the last cath.  No CAD.  Avoid hypotension. 2. HTN: Blood pressure higher at home, up to 0000000 systolic frequently. . Continue amlodipine. He did not tolerate lisinopril due to a rash and a feeling of perioral paresthesias. Try losartan 25 mg daily. BMet one week later. Refill blood pressure meds.  Decrease metoprolol back to BID ( he was taking it TID, on his own.). 3. Hyperlipidemia: Continue atorvastatin.  Check fasting lipids, electrolytes and LFTs.   Current medicines are reviewed at length with the patient today.  The patient concerns regarding his medicines were addressed.  The following changes have been made:  Add losartan  Labs/ tests ordered today include: lipids and CMEt No orders of the defined types were placed in this encounter.    Recommend 150 minutes/week of aerobic exercise Low fat, low carb, high fiber diet recommended  Disposition:   FU in 1 year with echo   Teresita Madura., MD  01/23/2016 10:10 AM    [facility] [address], [place],   [address] Phone: [phone]; Fax: [phone]

## 2016-01-23 ENCOUNTER — Ambulatory Visit (INDEPENDENT_AMBULATORY_CARE_PROVIDER_SITE_OTHER): Payer: BLUE CROSS/BLUE SHIELD | Admitting: Interventional Cardiology

## 2016-01-23 ENCOUNTER — Encounter: Payer: Self-pay | Admitting: Interventional Cardiology

## 2016-01-23 ENCOUNTER — Other Ambulatory Visit: Payer: BLUE CROSS/BLUE SHIELD

## 2016-01-23 VITALS — BP 128/88 | HR 56 | Ht 68.0 in | Wt 161.4 lb

## 2016-01-23 DIAGNOSIS — E785 Hyperlipidemia, unspecified: Secondary | ICD-10-CM | POA: Diagnosis not present

## 2016-01-23 DIAGNOSIS — I10 Essential (primary) hypertension: Secondary | ICD-10-CM

## 2016-01-23 DIAGNOSIS — I350 Nonrheumatic aortic (valve) stenosis: Secondary | ICD-10-CM | POA: Diagnosis not present

## 2016-01-23 MED ORDER — AMLODIPINE BESYLATE 10 MG PO TABS: ORAL_TABLET | ORAL | Status: DC

## 2016-01-23 MED ORDER — METOPROLOL SUCCINATE ER 25 MG PO TB24: 25.0000 mg | ORAL_TABLET | Freq: Two times a day (BID) | ORAL | Status: DC

## 2016-01-23 MED ORDER — ATORVASTATIN CALCIUM 10 MG PO TABS: 10.0000 mg | ORAL_TABLET | Freq: Every day | ORAL | Status: DC

## 2016-01-23 MED ORDER — LOSARTAN POTASSIUM 25 MG PO TABS: 25.0000 mg | ORAL_TABLET | Freq: Every day | ORAL | Status: DC

## 2016-01-23 NOTE — Patient Instructions (Signed)
Medication Instructions:  Decrease Metoprolol to 1 tablet twice a day, start taking Losartan 25 mg daily and your other medications will remain the same.  Labwork: 1 week BMET  Testing/Procedures: Your physician has requested that you have an echocardiogram. Echocardiography is a painless test that uses sound waves to create images of your heart. It provides your doctor with information about the size and shape of your heart and how well your heart's chambers and valves are working. This procedure takes approximately one hour. There are no restrictions for this procedure. To be done in 1 year just prior to f/u.   Follow-Up: Your physician wants you to follow-up in: 1 year. You will receive a reminder letter in the mail two months in advance. If you don't receive a letter, please call our office to schedule the follow-up appointment.   Any Other Special Instructions Will Be Listed Below (If Applicable).     If you need a refill on your cardiac medications before your next appointment, please call your pharmacy.

## 2016-01-31 ENCOUNTER — Other Ambulatory Visit (INDEPENDENT_AMBULATORY_CARE_PROVIDER_SITE_OTHER): Payer: BLUE CROSS/BLUE SHIELD | Admitting: *Deleted

## 2016-01-31 DIAGNOSIS — E785 Hyperlipidemia, unspecified: Secondary | ICD-10-CM | POA: Diagnosis not present

## 2016-01-31 LAB — COMPREHENSIVE METABOLIC PANEL
ALBUMIN: 4 g/dL (ref 3.6–5.1)
ALK PHOS: 43 U/L (ref 40–115)
ALT: 36 U/L (ref 9–46)
AST: 24 U/L (ref 10–35)
BILIRUBIN TOTAL: 0.7 mg/dL (ref 0.2–1.2)
BUN: 13 mg/dL (ref 7–25)
CALCIUM: 9.1 mg/dL (ref 8.6–10.3)
CO2: 23 mmol/L (ref 20–31)
CREATININE: 1.05 mg/dL (ref 0.70–1.25)
Chloride: 102 mmol/L (ref 98–110)
Glucose, Bld: 126 mg/dL — ABNORMAL HIGH (ref 65–99)
Potassium: 4.3 mmol/L (ref 3.5–5.3)
SODIUM: 137 mmol/L (ref 135–146)
TOTAL PROTEIN: 6.3 g/dL (ref 6.1–8.1)

## 2016-01-31 LAB — LIPID PANEL
CHOL/HDL RATIO: 2.5 ratio (ref <=5.0)
CHOLESTEROL: 136 mg/dL (ref 125–200)
HDL: 55 mg/dL (ref >=40)
LDL Cholesterol: 65 mg/dL (ref <130)
Triglycerides: 79 mg/dL (ref <150)
VLDL: 16 mg/dL (ref <30)

## 2016-01-31 NOTE — Addendum Note (Signed)
Addended by: Eulis Foster on: 01/31/2016 07:45 AM   Modules accepted: Orders

## 2016-02-02 ENCOUNTER — Other Ambulatory Visit: Payer: BLUE CROSS/BLUE SHIELD

## 2016-02-19 ENCOUNTER — Telehealth: Payer: Self-pay | Admitting: Interventional Cardiology

## 2016-02-19 NOTE — Telephone Encounter (Signed)
New message   Pt c/o medication issue:  1. Name of Medication: Losartin   2. How are you currently taking this medication (dosage and times per day)? 25mg  1x day 3. Are you having a reaction (difficulty breathing--STAT)? no 4. What is your medication issue? Waking up out of his sleep with  hot sweats  Pt question:do you want him to keep taking it or to stop it

## 2016-02-19 NOTE — Telephone Encounter (Signed)
**Note De-Identified Campbell Agramonte Obfuscation** Per Fuller Canada, Pharm D, the pt is advised to continue to take Losartan as advised and that if he continues to have hot flashes after 2 weeks or if the hot flashes are too bothersome for him to tolerate to call us back. He verbalized understanding and is in agreement with the plan.   Will forward to Dr Irish Lack as Juluis Rainier.

## 2016-03-18 ENCOUNTER — Other Ambulatory Visit: Payer: Self-pay | Admitting: *Deleted

## 2016-03-18 MED ORDER — LOSARTAN POTASSIUM 25 MG PO TABS: 25.0000 mg | ORAL_TABLET | Freq: Every day | ORAL | Status: DC

## 2016-04-10 ENCOUNTER — Encounter: Payer: Self-pay | Admitting: Surgery

## 2016-04-10 ENCOUNTER — Institutional Professional Consult (permissible substitution) (INDEPENDENT_AMBULATORY_CARE_PROVIDER_SITE_OTHER): Payer: BLUE CROSS/BLUE SHIELD | Admitting: Surgery

## 2016-04-10 VITALS — BP 137/83 | HR 79 | Resp 20 | Ht 68.0 in | Wt 161.0 lb

## 2016-04-10 DIAGNOSIS — I350 Nonrheumatic aortic (valve) stenosis: Secondary | ICD-10-CM

## 2016-04-11 ENCOUNTER — Encounter: Payer: Self-pay | Admitting: Surgery

## 2016-04-11 NOTE — Progress Notes (Signed)
Cardiothoracic Surgery Consultation  PCP is [name], MD Referring Provider is [name], MD  Chief Complaint  Patient presents with  . 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 Review of his echo reports in [place] showed a mean gradient of 14 mm Hg in 01/2011 with aortic valve sclerosis and a mean gradient of 15 mm Hg in 03/2013 with some calcification and thickening of the aortic valve. His first echo here in 11/2014 showed a possible bicuspid valve with moderately thickened and calcified leaflets with a mean gradient of 21 mm Hg and normal LV function. His most recent echo on 12/20/2015 shows an increase in the mean gradient to 35 mm Hg with a dimensionless index of 0.22. The AVA index was 0.37 cm2/m2.The leaflets are severely calcified. LV function is normal with mild LVH and normal LV dimensions. The aorta is normal sized. He had an exercise tolerance test on 12/25/2015 which showed ST depression during stress in II, III, aVF, V5 and V6 leads, beginning at 6 minutes of stress, ending at 10 minutes of stress, and returning to baseline after 5-9 minutes of recovery. He subsequently underwent cardiac cath on 12/25/2015 which showed no significant coronary disease and moderate AS with a mean gradient of 33 mm Hg with a calculated AVA of 1.2 cm2.  He remains very active exercising vigorously several days per week. He has not been limited in what he can do. He just got back from a trip to [place] and had no problems with activity.   Past Medical History  Diagnosis Date  . Hypertension     Past Surgical History  Procedure Laterality Date  . Supravalvular aortic stenosis repair N/A 2006  . Cardiac catheterization N/A 12/25/2015    Procedure: Right/Left Heart  Cath and Coronary Angiography;  Surgeon: Jettie Booze, MD;  Location: [facility];  Service: Cardiovascular;  Laterality: N/A;    Family History  Problem Relation Age of Onset  . Hypertension Mother   . Heart disease Mother   . CAD Mother   . Cardiomyopathy Mother   . Diabetes Mother   . Brain cancer Father   . Colon cancer Neg Hx   . Heart attack Neg Hx   . Stroke Neg Hx     Social History Social History  Substance Use Topics  . Smoking status: Never Smoker   . Smokeless tobacco: Never Used  . Alcohol Use: 0.0 oz/week    0 Standard drinks or equivalent per week     Comment: occasionally     Current Outpatient Prescriptions  Medication Sig Dispense Refill  . amLODipine (NORVASC) 10 MG tablet TAKE 1 TABLET (10 MG TOTAL) BY MOUTH DAILY. 90 tablet 3  . aspirin 81 MG tablet Take 81 mg by mouth daily.    Marland Kitchen atorvastatin (LIPITOR) 10 MG tablet Take 1 tablet (10 mg total) by mouth daily. 90 tablet 3  . losartan (COZAAR) 25 MG tablet Take 1 tablet (25 mg total) by mouth daily. 90 tablet 3  . metoprolol succinate (TOPROL-XL) 25 MG 24 hr tablet Take 1 tablet (25 mg total) by mouth 2 (two) times daily. 180 tablet 3  . Omega-3 Fatty Acids (FISH OIL) 1000 MG CAPS Take 1,000  mg by mouth daily.    . vitamin C (ASCORBIC ACID) 500 MG tablet Take 500 mg by mouth daily.     No current facility-administered medications for this visit.    Allergies  Allergen Reactions  . Lisinopril     Rash and lip tingling    Review of Systems  Constitutional: Negative for activity change, appetite change, fatigue and unexpected weight change.  HENT: Negative.   Eyes: Negative.   Respiratory: Negative for shortness of breath.   Cardiovascular: Negative for chest pain, palpitations and leg swelling.  Endocrine: Negative.   Genitourinary: Negative.   Musculoskeletal: Negative.   Skin: Negative.   Allergic/Immunologic: Negative.   Neurological: Negative.   Hematological: Negative.     Psychiatric/Behavioral: Negative.     BP 137/83 mmHg  Pulse 79  Resp 20  Ht 5\' 8"  (1.727 m)  Wt 161 lb (73.029 kg)  BMI 24.49 kg/m2  SpO2 98% Physical Exam  Constitutional: He is oriented to person, place, and time. He appears well-developed and well-nourished. No distress.  HENT:  Head: Normocephalic and atraumatic.  Eyes: EOM are normal. Pupils are equal, round, and reactive to light.  Neck: Normal range of motion. Neck supple. No JVD present.  Cardiovascular: Normal rate, regular rhythm and intact distal pulses.   Murmur heard. 3/6 systolic murmur along RSB  Pulmonary/Chest: Effort normal and breath sounds normal.  Abdominal: Soft. He exhibits no distension. There is no tenderness.  Musculoskeletal: He exhibits no edema.  Neurological: He is alert and oriented to person, place, and time.  Skin: Skin is warm and dry.  Psychiatric: He has a normal mood and affect.     Diagnostic Tests:   [facility]  [address], [place] [address]  [phone]  ------------------------------------------------------------------- Transthoracic Echocardiography  Patient: Ronald, Gomez MR #: [id number] Study Date: 12/20/2015 Gender: M Age: 74 Height: 172.7 cm Weight: 73.9 kg BSA: 1.89 m^2 Pt. Status: Room:  SONOGRAPHER Marygrace Drought, RCS ATTENDING [place], [place], Outpatient  cc:  ------------------------------------------------------------------- LV EF: 55% - 60%  ------------------------------------------------------------------- Study Conclusions  - Left ventricle: The cavity size was mildly dilated. Wall  thickness was increased in a pattern of mild LVH. Systolic  function was normal. The estimated ejection fraction was in the  range  of 55% to 60%. - Aortic valve: Mean gradient has increased from 21 mmHg to 35 mmHg  since last echo Cannot r/o bicuspid morphology. There was  moderate to severe stenosis. There was trivial regurgitation. - Left atrium: The atrium was mildly dilated. - Right atrium: The atrium was mildly dilated. - Atrial septum: No defect or patent foramen ovale was identified.  Transthoracic echocardiography. M-mode, complete 2D, spectral Doppler, and color Doppler. Birthdate: Patient birthdate: 31-May-1951. Age: Patient is 65 yr old. Sex: Gender: male. BMI: 24.8 kg/m^2. Blood pressure: 158/95 Patient status: Outpatient. Study date: Study date: 12/20/2015. Study time: 07:47 AM. Location: [facility]  -------------------------------------------------------------------  ------------------------------------------------------------------- Left ventricle: The cavity size was mildly dilated. Wall thickness was increased in a pattern of mild LVH. Systolic function was normal. The estimated ejection fraction was in the range of 55% to 60%.  ------------------------------------------------------------------- Aortic valve: Mean gradient has increased from 21 mmHg to 35 mmHg since last echo Cannot r/o bicuspid morphology. Severely calcified leaflets. Doppler: There was moderate to severe stenosis. There was trivial regurgitation. VTI ratio of LVOT to aortic valve: 0.24. Valve area (VTI): 0.76 cm^2.  Indexed valve area (VTI): 0.4 cm^2/m^2. Peak velocity ratio of LVOT to aortic valve: 0.22. Valve area (Vmax): 0.7 cm^2. Indexed valve area (Vmax): 0.37 cm^2/m^2. Mean velocity ratio of LVOT to aortic valve: 0.21. Valve area (Vmean): 0.67 cm^2. Indexed valve area (Vmean): 0.35 cm^2/m^2.  Mean gradient (S): 35 mm Hg. Peak gradient (S): 58 mm Hg.  ------------------------------------------------------------------- Aorta: The aorta was normal, not dilated, and  non-diseased.  ------------------------------------------------------------------- Mitral valve: Mildly thickened leaflets . Doppler: There was trivial regurgitation. Peak gradient (D): 3 mm Hg.  ------------------------------------------------------------------- Left atrium: The atrium was mildly dilated.  ------------------------------------------------------------------- Atrial septum: No defect or patent foramen ovale was identified.  ------------------------------------------------------------------- Pulmonic valve: Doppler: There was mild regurgitation.  ------------------------------------------------------------------- Tricuspid valve: Doppler: There was mild regurgitation.  ------------------------------------------------------------------- Right atrium: The atrium was mildly dilated.  ------------------------------------------------------------------- Pericardium: The pericardium was normal in appearance.  ------------------------------------------------------------------- Systemic veins: Inferior vena cava: The vessel was dilated. The respirophasic diameter changes were in the normal range (= 50%), consistent with normal central venous pressure. Diameter: 23 mm.  ------------------------------------------------------------------- Post procedure conclusions Ascending Aorta:  - The aorta was normal, not dilated, and non-diseased.  ------------------------------------------------------------------- Measurements  IVC Value Reference ID 23 mm ---------  Left ventricle Value Reference LV ID, ED, PLAX chordal 47.2 mm 43 - 52 LV ID, ES, PLAX chordal 31.7 mm 23 - 38 LV fx shortening, PLAX chordal 33 % >=29 LV PW thickness,  ED 12.9 mm --------- IVS/LV PW ratio, ED 0.92 <=1.3 Stroke volume, 2D 72 ml --------- Stroke volume/bsa, 2D 38 ml/m^2 --------- LV ejection fraction, 1-p A4C 67 % --------- LV e&', lateral 7.07 cm/s --------- LV E/e&', lateral 11.6 --------- LV e&', medial 7.51 cm/s --------- LV E/e&', medial 10.92 --------- LV e&', average 7.29 cm/s --------- LV E/e&', average 11.25 ---------  Ventricular septum Value Reference IVS thickness, ED 11.9 mm ---------  LVOT Value Reference LVOT ID, S 20 mm --------- LVOT area 3.14 cm^2 --------- LVOT peak velocity, S 84 cm/s --------- LVOT mean velocity, S 59 cm/s --------- LVOT VTI, S 22.9 cm ---------  Aortic valve Value Reference Aortic valve peak velocity, S 379 cm/s --------- Aortic valve mean velocity, S 277 cm/s --------- Aortic valve VTI, S 94.5 cm --------- Aortic mean gradient, S 35 mm Hg --------- Aortic peak gradient, S 58 mm Hg --------- VTI ratio, LVOT/AV 0.24 --------- Aortic valve area, VTI 0.76 cm^2 --------- Aortic valve area/bsa, VTI 0.4 cm^2/m^2 --------- Velocity ratio,  peak, LVOT/AV 0.22 --------- Aortic valve area, peak velocity 0.7 cm^2 --------- Aortic valve area/bsa, peak 0.37 cm^2/m^2 --------- velocity Velocity ratio, mean, LVOT/AV 0.21 --------- Aortic valve area, mean velocity 0.67 cm^2 --------- Aortic valve area/bsa, mean 0.35 cm^2/m^2 --------- velocity Aortic regurg pressure half-time 554 ms ---------  Aorta Value Reference Aortic root ID, ED 31 mm ---------  Left atrium Value Reference LA ID, A-P, ES 42 mm --------- LA ID/bsa, A-P (H) 2.22 cm/m^2 <=2.2 LA volume, S 55.8 ml --------- LA volume/bsa, S 29.5 ml/m^2 --------- LA volume, ES, 1-p A4C 42.5 ml --------- LA volume/bsa, ES, 1-p A4C 22.5 ml/m^2 --------- LA volume, ES, 1-p A2C 60.7 ml --------- LA volume/bsa, ES, 1-p A2C 32.1 ml/m^2 ---------  Mitral valve Value Reference Mitral E-wave peak velocity 82 cm/s --------- Mitral A-wave peak velocity 91.2 cm/s --------- Mitral deceleration time (H) 236 ms 150 - 230 Mitral peak gradient, D 3 mm Hg --------- Mitral E/A ratio, peak 0.9 ---------  Tricuspid valve Value Reference Tricuspid regurg peak velocity 229 cm/s --------- Tricuspid peak RV-RA gradient 21  mm Hg ---------  Right ventricle Value  Reference TAPSE 26.3 mm --------- RV s&', lateral, S 11 cm/s ---------  Legend: (L) and (H) mark values outside specified reference range.  ------------------------------------------------------------------- Prepared and Electronically Authenticated by  Jenkins Rouge, M.D. 2017-02-01T12:25:47  Jettie Booze, MD (Primary)      Procedures    Right/Left Heart Cath and Coronary Angiography    Conclusion     The left ventricular systolic function is normal.  Moderate aortic stenosis with calculated valve area of 1.2 cm. Mean gradient 33 mm Hg. Cardiac output 5.3 L/m. Cardiac index 2.8. Pulmonary artery saturation 74%.  Normal right heart pressures.  No significant coronary artery disease.  Continue with preventive therapy. Continue regular exercise and watch for worsening sx of aortic stenosis. Will cc: Dr. Cyndia Bent per the patient's request.      Indications    Abnormal stress test [R94.39 (ICD-10-CM)]   Aortic stenosis [I35.0 (ICD-10-CM)]    Technique and Indications    The risks, benefits, and details of the procedure were explained to the patient. The patient verbalized understanding and wanted to proceed. Informed written consent was obtained.  PROCEDURE TECHNIQUE: After Xylocaine anesthesia, a 5 French brachial sheath was placed in the right AC area exchanged for a peripheral IV. A 5 French balloontipped Swan-Ganz catheter was advanced to the pulmonary artery under fluoroscopic guidance, using a 0.25 glide wire. Hemodynamic pressures were obtained. Oxygen saturations were obtained. After Xylocaine anesthesia, a 43F sheath was placed in the right radial artery with a single anterior needle wall stick. Left coronary angiography was done using a Judkins 3.5 guide catheter. Right coronary angiography was done using a Judkins R4 guide catheter. Left heart cath was done using a pigtail  catheter. We were able to cross the aortic valve with a J-tip wire in the JR4 catheter. The pigtail catheter was then placed into the LV over the wire.  We tried to obtain simultaneous pressures from the right radial sheath and the LV catheter but due to a kink in the sheath, we were unable to get this pressure. Calculations were done using a pullback. A straight wire was not required.    Contrast: 80 cc  Estimated blood loss <50 mL. There were no immediate complications during the procedure.    Coronary Findings    Dominance: Right   Left Anterior Descending   . Mid LAD lesion, 10% stenosed.       Right Heart Pressures LV EDP is normal. Normal pulmonary artery pressures    Wall Motion                 Left Heart    Left Ventricle The left ventricular size is normal. The left ventricular systolic function is normal. The left ventricular ejection fraction is 55-65% by visual estimate. There are no wall motion abnormalities in the left ventricle.   Aortic Valve There is moderate aortic valve stenosis. The aortic valve is calcified.    Coronary Diagrams    Diagnostic Diagram            Implants     No implant documentation for this case.    PACS Images    Show images for Cardiac catheterization     Link to Procedure Log    Procedure Log      Hemo Data       Most Recent Value   Fick Cardiac Output  5.35 L/min   Fick Cardiac Output Index  2.89 (L/min)/BSA   Aortic  Mean Gradient  33.6 mmHg   Aortic Peak Gradient  30 mmHg   Aortic Valve Area  1.26   Aortic Value Area Index  0.68 cm2/BSA   RA A Wave  3 mmHg   RA V Wave  1 mmHg   RA Mean  0 mmHg   RV Systolic Pressure  19 mmHg   RV Diastolic Pressure  0 mmHg   RV EDP  5 mmHg   PA Systolic Pressure  21 mmHg   PA Diastolic Pressure  3 mmHg   PA Mean  11 mmHg   PW A Wave  7 mmHg   PW V Wave  6 mmHg   PW Mean  6 mmHg   AO Systolic Pressure  123456 mmHg   AO Diastolic  Pressure  63 mmHg   AO Mean  86 mmHg   LV Systolic Pressure  [id number] mmHg   LV Diastolic Pressure  2 mmHg   LV EDP  10 mmHg   Arterial Occlusion Pressure Extended Systolic Pressure  [id number] mmHg   Arterial Occlusion Pressure Extended Diastolic Pressure  64 mmHg   Arterial Occlusion Pressure Extended Mean Pressure  88 mmHg   Left Ventricular Apex Extended Systolic Pressure  123456 mmHg   Left Ventricular Apex Extended Diastolic Pressure  1 mmHg   Left Ventricular Apex Extended EDP Pressure  8 mmHg   QP/QS  1   TPVR Index  4.5 HRUI   TSVR Index  29.74 HRUI   TPVR/TSVR Ratio  0.15       Impression:  He has stage B-C1 asymptomatic moderate to severe aortic stenosis with a possible bicuspid aortic valve. His mean gradient puts him in the moderate to severe range but his DI and indexed AVA are clearly in the severe range. He exercises vigorously without symptoms although his ETT clearly showed ST depression with heavy exertion. His LV function is normal and LV dimensions are within normal limits. I think it is reasonable to continue following this for now since there is no clear surgical indication. If he becomes symptomatic or his mean gradient rises to 40 mm Hg or peak velocity rises to 4 m/sec then he should proceed with AVR. I reviewed all of his studies with him and his wife and answered their questions. I think it would be best to repeat his echo in 6 months from the last one since his aortic stenosis is probably in the early severe range. I told him that I thought it was fine to exercise but he should keep his HR less than 130 and not push himself to the point of developing shortness of breath or marked fatigue.   Plan:  I will see him back in August with a 2D echo.  Gaye Pollack, MD [facility] [phone]

## 2016-06-06 ENCOUNTER — Other Ambulatory Visit: Payer: Self-pay | Admitting: *Deleted

## 2016-06-06 DIAGNOSIS — I350 Nonrheumatic aortic (valve) stenosis: Secondary | ICD-10-CM

## 2016-06-19 ENCOUNTER — Ambulatory Visit (HOSPITAL_COMMUNITY): Payer: BLUE CROSS/BLUE SHIELD | Attending: Cardiovascular Disease

## 2016-06-19 ENCOUNTER — Other Ambulatory Visit: Payer: Self-pay

## 2016-06-19 DIAGNOSIS — I350 Nonrheumatic aortic (valve) stenosis: Secondary | ICD-10-CM

## 2016-06-19 DIAGNOSIS — E785 Hyperlipidemia, unspecified: Secondary | ICD-10-CM | POA: Diagnosis not present

## 2016-06-19 DIAGNOSIS — I071 Rheumatic tricuspid insufficiency: Secondary | ICD-10-CM | POA: Diagnosis not present

## 2016-06-19 DIAGNOSIS — I119 Hypertensive heart disease without heart failure: Secondary | ICD-10-CM | POA: Insufficient documentation

## 2016-06-19 DIAGNOSIS — I352 Nonrheumatic aortic (valve) stenosis with insufficiency: Secondary | ICD-10-CM | POA: Diagnosis not present

## 2016-07-10 ENCOUNTER — Ambulatory Visit (INDEPENDENT_AMBULATORY_CARE_PROVIDER_SITE_OTHER): Payer: BLUE CROSS/BLUE SHIELD | Admitting: Surgery

## 2016-07-10 ENCOUNTER — Other Ambulatory Visit: Payer: Self-pay | Admitting: *Deleted

## 2016-07-10 ENCOUNTER — Encounter: Payer: Self-pay | Admitting: Surgery

## 2016-07-10 VITALS — BP 140/82 | HR 76 | Resp 16 | Ht 68.0 in | Wt 159.6 lb

## 2016-07-10 DIAGNOSIS — I350 Nonrheumatic aortic (valve) stenosis: Secondary | ICD-10-CM

## 2016-07-10 NOTE — Progress Notes (Signed)
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 I have personally reviewed the study and it shows progression of his mean gradient from 35 mm Hg in February to 42-47 mm Hg on the current study. The dimensionless index is 0.21. There is moderate AI. LVEF is normal. The LVID is slightly larger in diastole at 50 mm. He says that he is still exercising a lot and can do 45 minutes on his aerodyne or rowing machine and burn up 500 calories in that time. He is short of breath at the end but has no chest pain or significant dizziness. His stamina with normal daily activities otherwise is good. Some SOB with climbing 15 stairs at home but recovers in a few minutes.  Current Outpatient Prescriptions  Medication Sig Dispense Refill  . amLODipine (NORVASC) 10 MG tablet TAKE 1 TABLET (10 MG TOTAL) BY MOUTH DAILY. 90 tablet 3  . aspirin 81 MG tablet Take 81 mg by mouth daily.    Marland Kitchen atorvastatin (LIPITOR) 10 MG tablet Take 1 tablet (10 mg total) by mouth daily. 90 tablet 3  . losartan (COZAAR) 25 MG tablet Take 1 tablet (25 mg total) by mouth daily. 90 tablet 3  . metoprolol succinate (TOPROL-XL) 25 MG 24 hr tablet Take 1 tablet (25 mg total) by mouth 2 (two) times daily. 180 tablet 3  . Omega-3 Fatty Acids (FISH OIL) 1000 MG CAPS Take 1,000 mg by mouth daily.    . vitamin C (ASCORBIC ACID) 500 MG tablet Take 500 mg by mouth daily.    Marland Kitchen zolpidem (AMBIEN) 5 MG tablet Take 5 mg by mouth at bedtime as needed for sleep.     No current facility-administered medications for this visit.      Physical Exam: BP 140/82   Pulse 76   Resp 16   Ht 5\' 8"  (1.727 m)   Wt 159 lb 9.6 oz (72.4 kg)   SpO2 98% Comment: ON RA  BMI 24.27 kg/m  He looks well Cardiac exam shows a regular rate and rhythm with a 3/6 systolic murmur along the RSB. There is no diastolic murmur. Lungs are clear There is no peripheral edema  Diagnostic Tests:      [facility]                        [address], [place] [address]                            [phone]  ------------------------------------------------------------------- Transthoracic Echocardiography  Patient:    Jadus, Yack MR #:       [id number] Study Date: 06/19/2016 Gender:     M Age:        65 Height:     172.7 cm Weight:     73 kg BSA:        1.88 m^2 Pt. Status: Room:   ORDERING     Gilford Raid, MD  REFERRING    Gilford Raid, MD  SONOGRAPHER  [facility], [place], Outpatient  ATTENDING    Skeet Latch, MD  cc:  ------------------------------------------------------------------- LV EF: 60% -   65%  ------------------------------------------------------------------- Indications:  Aortic stenosis (I35.0).  ------------------------------------------------------------------- History:   PMH:   Aortic valve disease.  Risk factors: Hypertension. Dyslipidemia.  ------------------------------------------------------------------- Study Conclusions  - Left ventricle: The cavity size was normal. There was mild   concentric hypertrophy. Systolic function was normal. The   estimated ejection fraction was in the range of 60% to 65%. Wall   motion was normal; there were no regional wall motion   abnormalities. Left ventricular diastolic function parameters   were normal. - Aortic valve: Valve mobility was restricted. There was severe   stenosis. There was moderate regurgitation. - Mitral valve: Transvalvular velocity was within the normal range.   There was no evidence for stenosis. There was no regurgitation. - Left atrium: The atrium was moderately dilated. - Right ventricle: The cavity size was normal. Wall thickness was   normal. Systolic function was normal. - Tricuspid valve: There was trivial regurgitation. - Pulmonary arteries: Systolic pressure was within the normal   range. PA  peak pressure: 42 mm Hg (S).  Impressions:  - Mean aortic valve gradient has increased from 35 mmHg to 47 mmHg   since 12/2015. The aortic stenosis is now severe.  ------------------------------------------------------------------- Labs, prior tests, procedures, and surgery: Transthoracic echocardiography (12/20/2015).    The aortic valve showed moderate to severe stenosis.  EF was 60%. Aortic valve: peak gradient of 58 mm Hg and mean gradient of 35 mm Hg.  ------------------------------------------------------------------- Study data:  Comparison was made to the study of 12/20/2015.  Study status:  Routine.  Procedure:  The patient reported no pain pre or post test. Transthoracic echocardiography. Image quality was adequate.  Study completion:  There were no complications. Transthoracic echocardiography.  M-mode, complete 2D, spectral Doppler, and color Doppler.  Birthdate:  Patient birthdate: Apr 01, 1951.  Age:  Patient is 65 yr old.  Sex:  Gender: male. BMI: 24.5 kg/m^2.  Blood pressure:     128/88  Patient status: Outpatient.  Study date:  Study date: 06/19/2016. Study time: 01:04 PM.  Location:  [facility]  -------------------------------------------------------------------  ------------------------------------------------------------------- Left ventricle:  The cavity size was normal. There was mild concentric hypertrophy. Systolic function was normal. The estimated ejection fraction was in the range of 60% to 65%. Wall motion was normal; there were no regional wall motion abnormalities. The transmitral flow pattern was normal. The deceleration time of the early transmitral flow velocity was normal. The pulmonary vein flow pattern was normal. The tissue Doppler parameters were normal. Left ventricular diastolic function parameters were normal.  ------------------------------------------------------------------- Aortic valve:   Trileaflet; moderately thickened,  moderately calcified leaflets. Valve mobility was restricted.  Doppler: There was severe stenosis.   There was moderate regurgitation. VTI ratio of LVOT to aortic valve: 0.23. Valve area (VTI): 0.89 cm^2. Indexed valve area (VTI): 0.47 cm^2/m^2. Peak velocity ratio of LVOT to aortic valve: 0.21. Valve area (Vmax): 0.78 cm^2. Indexed valve area (Vmax): 0.42 cm^2/m^2. Mean velocity ratio of LVOT to aortic valve: 0.2. Valve area (Vmean): 0.77 cm^2. Indexed valve area (Vmean): 0.41 cm^2/m^2.    Mean gradient (S): 47 mm Hg. Peak gradient (S): 75 mm Hg.  ------------------------------------------------------------------- Aorta:  Aortic root: The aortic root was normal in size.  ------------------------------------------------------------------- Mitral valve:   Structurally normal valve.   Mobility was not restricted.  Doppler:  Transvalvular velocity was within the normal range. There was no evidence for stenosis. There was no regurgitation.    Peak gradient (D): 4 mm Hg.  ------------------------------------------------------------------- Left atrium:  The atrium was moderately dilated.  -------------------------------------------------------------------  Right ventricle:  The cavity size was normal. Wall thickness was normal. Systolic function was normal.  ------------------------------------------------------------------- Pulmonic valve:    Structurally normal valve.   Cusp separation was normal.  Doppler:  Transvalvular velocity was within the normal range. There was no evidence for stenosis. There was no regurgitation.  ------------------------------------------------------------------- Tricuspid valve:   Structurally normal valve.    Doppler: Transvalvular velocity was within the normal range. There was trivial regurgitation.  ------------------------------------------------------------------- Pulmonary artery:   The main pulmonary artery was normal-sized. Systolic  pressure was within the normal range.  ------------------------------------------------------------------- Right atrium:  The atrium was normal in size.  ------------------------------------------------------------------- Pericardium:  There was no pericardial effusion.  ------------------------------------------------------------------- Systemic veins: Inferior vena cava: The vessel was normal in size. The respirophasic diameter changes were blunted (< 50%), consistent with normal central venous pressure.  ------------------------------------------------------------------- Measurements   Left ventricle                           Value           Reference  LV ID, ED, PLAX chordal                  50     mm       43 - 52  LV ID, ES, PLAX chordal                  32.7   mm       23 - 38  LV fx shortening, PLAX chordal           35     %        >=29  LV PW thickness, ED                      10.4   mm       ---------  IVS/LV PW ratio, ED                      1               <=1.3  Stroke volume, 2D                        88     ml       ---------  Stroke volume/bsa, 2D                    47     ml/m^2   ---------  LV e&', lateral                           10.7   cm/s     ---------  LV E/e&', lateral                         9.91            ---------  LV e&', medial                            7.54   cm/s     ---------  LV E/e&', medial                          14.06           ---------  LV e&', average                           9.12   cm/s     ---------  LV E/e&', average                         11.62           ---------    Ventricular septum                       Value           Reference  IVS thickness, ED                        10.4   mm       ---------    LVOT                                     Value           Reference  LVOT ID, S                               22     mm       ---------  LVOT area                                3.8    cm^2     ---------  LVOT peak velocity, S                     89.2   cm/s     ---------  LVOT mean velocity, S                    64.9   cm/s     ---------  LVOT VTI, S                              23.1   cm       ---------    Aortic valve                             Value           Reference  Aortic valve peak velocity, S            432    cm/s     ---------  Aortic valve mean velocity, S            320    cm/s     ---------  Aortic valve VTI, S                      98.8   cm       ---------  Aortic mean gradient, S                  47     mm Hg    ---------  Aortic peak gradient, S  75     mm Hg    ---------  VTI ratio, LVOT/AV                       0.23            ---------  Aortic valve area, VTI                   0.89   cm^2     ---------  Aortic valve area/bsa, VTI               0.47   cm^2/m^2 ---------  Velocity ratio, peak, LVOT/AV            0.21            ---------  Aortic valve area, peak velocity         0.78   cm^2     ---------  Aortic valve area/bsa, peak              0.42   cm^2/m^2 ---------  velocity  Velocity ratio, mean, LVOT/AV            0.2             ---------  Aortic valve area, mean velocity         0.77   cm^2     ---------  Aortic valve area/bsa, mean              0.41   cm^2/m^2 ---------  velocity  Aortic regurg peak velocity              219.48 cm/s     ---------  Aortic regurg deceleration               159    cm/s^2   ---------  Aortic regurg deceleration time          1376   ms       ---------  Aortic regurg pressure half-time         399    ms       ---------  Aortic regurg peak gradient              19     mm Hg    ---------    Aorta                                    Value           Reference  Aortic root ID, ED                       35     mm       ---------  Ascending aorta ID, A-P, S               34     mm       ---------    Left atrium                              Value           Reference  LA ID, A-P, ES                           40     mm       ---------  LA  ID/bsa, A-P                           2.13   cm/m^2   <=2.2  LA volume, S                             70     ml       ---------  LA volume/bsa, S                         37.2   ml/m^2   ---------  LA volume, ES, 1-p A4C                   53     ml       ---------  LA volume/bsa, ES, 1-p A4C               28.2   ml/m^2   ---------  LA volume, ES, 1-p A2C                   78     ml       ---------  LA volume/bsa, ES, 1-p A2C               41.5   ml/m^2   ---------    Mitral valve                             Value           Reference  Mitral E-wave peak velocity              106    cm/s     ---------  Mitral A-wave peak velocity              78.5   cm/s     ---------  Mitral deceleration time                 218    ms       150 - 230  Mitral peak gradient, D                  4      mm Hg    ---------  Mitral E/A ratio, peak                   1.4             ---------    Pulmonary arteries                       Value           Reference  PA pressure, S, DP               (H)     42     mm Hg    <=30    Tricuspid valve                          Value           Reference  Tricuspid regurg peak velocity           290    cm/s     ---------  Tricuspid peak RV-RA gradient  34     mm Hg    ---------    Systemic veins                           Value           Reference  Estimated CVP                            8      mm Hg    ---------    Right ventricle                          Value           Reference  RV pressure, S, DP               (H)     42     mm Hg    <=30  RV s&', lateral, S                        14.4   cm/s     ---------  Legend: (L)  and  (H)  mark values outside specified reference range.  ------------------------------------------------------------------- Prepared and Electronically Authenticated by  Skeet Latch, MD 2017-08-02T16:25:37  Impression:  He has continued progression of aortic stenosis into the severe range with a rise in the mean gradient to  42-47 mm Hg. Ventricular function is normal. He still has very good exercise tolerance but is clearly symptomatic compared to last year. I think AVR is indicated at this time. He would like to wait until October because he has some activities planned that he would like to attend. I think this should be fine. I cautioned him about pushing too hard during his workouts and trying to back off some to prevent over-exhaustion. I discussed the pros and cons of mechanical and tissue valves with him and his wife and he would like to use a pericardial valve which I think is a good choice at his age.   Plan:  We will plan on AVR using a pericardial valve on Thursday 08/29/2016.   Gaye Pollack, MD [facility] [phone]

## 2016-08-06 ENCOUNTER — Telehealth: Payer: Self-pay | Admitting: Interventional Cardiology

## 2016-08-06 NOTE — Telephone Encounter (Signed)
New Message  Pt call requesting to speak with RN. Pt states he will be have a procedure with Dr. Cyndia Bent and want to inform Dr. Irish Lack. Pt would like a call back to discuss with RN. Please call back to discuss

## 2016-08-06 NOTE — Telephone Encounter (Signed)
Returned pt's call. He wanted to let Dr. Irish Lack know of upcoming aortic valve replacement surgery scheduled with Dr. Cyndia Bent on 08/29/16. I explained I would forward this information to Dr. Irish Lack and his nurse.

## 2016-08-06 NOTE — Telephone Encounter (Signed)
Dr. Cyndia Bent sent me a note.  I am aware.  Best of luck with the surgery.

## 2016-08-08 NOTE — Telephone Encounter (Signed)
**Note De-Identified Shawn Dannenberg Obfuscation** The pt has been given message. He thanked me for calling him back.

## 2016-08-27 ENCOUNTER — Ambulatory Visit (HOSPITAL_BASED_OUTPATIENT_CLINIC_OR_DEPARTMENT_OTHER)
Admission: RE | Admit: 2016-08-27 | Discharge: 2016-08-27 | Disposition: A | Discharge status: Home / Self Care | Payer: BLUE CROSS/BLUE SHIELD | Source: Ambulatory Visit | Attending: Surgery | Admitting: Surgery

## 2016-08-27 ENCOUNTER — Encounter (HOSPITAL_COMMUNITY)
Admission: RE | Admit: 2016-08-27 | Discharge: 2016-08-27 | Disposition: A | Discharge status: Home / Self Care | Payer: BLUE CROSS/BLUE SHIELD | Source: Ambulatory Visit | Attending: Surgery | Admitting: Surgery

## 2016-08-27 ENCOUNTER — Encounter (HOSPITAL_COMMUNITY): Payer: Self-pay

## 2016-08-27 ENCOUNTER — Other Ambulatory Visit (HOSPITAL_COMMUNITY): Payer: Self-pay | Admitting: *Deleted

## 2016-08-27 ENCOUNTER — Ambulatory Visit (HOSPITAL_COMMUNITY)
Admission: RE | Admit: 2016-08-27 | Discharge: 2016-08-27 | Disposition: A | Discharge status: Home / Self Care | Payer: BLUE CROSS/BLUE SHIELD | Source: Ambulatory Visit | Attending: Surgery | Admitting: Surgery

## 2016-08-27 DIAGNOSIS — Z01810 Encounter for preprocedural cardiovascular examination: Secondary | ICD-10-CM | POA: Insufficient documentation

## 2016-08-27 DIAGNOSIS — I350 Nonrheumatic aortic (valve) stenosis: Secondary | ICD-10-CM

## 2016-08-27 DIAGNOSIS — Z01812 Encounter for preprocedural laboratory examination: Secondary | ICD-10-CM

## 2016-08-27 HISTORY — DX: Dyspnea, unspecified: R06.00

## 2016-08-27 HISTORY — DX: Nonrheumatic aortic (valve) stenosis: I35.0

## 2016-08-27 HISTORY — DX: Insomnia, unspecified: G47.00

## 2016-08-27 HISTORY — DX: Anxiety disorder, unspecified: F41.9

## 2016-08-27 LAB — COMPREHENSIVE METABOLIC PANEL
ALBUMIN: 4.8 g/dL (ref 3.5–5.0)
ALT: 32 U/L (ref 17–63)
ANION GAP: 10 (ref 5–15)
AST: 26 U/L (ref 15–41)
Alkaline Phosphatase: 60 U/L (ref 38–126)
BILIRUBIN TOTAL: 0.8 mg/dL (ref 0.3–1.2)
BUN: 14 mg/dL (ref 6–20)
CO2: 23 mmol/L (ref 22–32)
Calcium: 9.7 mg/dL (ref 8.9–10.3)
Chloride: 103 mmol/L (ref 101–111)
Creatinine, Ser: 1 mg/dL (ref 0.61–1.24)
GFR calc Af Amer: >60 mL/min (ref >60)
GLUCOSE: 112 mg/dL — AB (ref 65–99)
POTASSIUM: 3.8 mmol/L (ref 3.5–5.1)
Sodium: 136 mmol/L (ref 135–145)
TOTAL PROTEIN: 7.4 g/dL (ref 6.5–8.1)

## 2016-08-27 LAB — URINALYSIS, ROUTINE W REFLEX MICROSCOPIC
BILIRUBIN URINE: NEGATIVE
GLUCOSE, UA: NEGATIVE mg/dL
HGB URINE DIPSTICK: NEGATIVE
Ketones, ur: NEGATIVE mg/dL
Leukocytes, UA: NEGATIVE
Nitrite: NEGATIVE
PH: 5 (ref 5.0–8.0)
Protein, ur: NEGATIVE mg/dL
SPECIFIC GRAVITY, URINE: 1.007 (ref 1.005–1.030)

## 2016-08-27 LAB — TYPE AND SCREEN
ABO/RH(D): A POS
ANTIBODY SCREEN: NEGATIVE

## 2016-08-27 LAB — CBC
HEMATOCRIT: 43.4 % (ref 39.0–52.0)
Hemoglobin: 16.3 g/dL (ref 13.0–17.0)
MCH: 33.1 pg (ref 26.0–34.0)
MCHC: 37.6 g/dL — AB (ref 30.0–36.0)
MCV: 88 fL (ref 78.0–100.0)
Platelets: 198 10*3/uL (ref 150–400)
RBC: 4.93 MIL/uL (ref 4.22–5.81)
RDW: 12.1 % (ref 11.5–15.5)
WBC: 5.5 10*3/uL (ref 4.0–10.5)

## 2016-08-27 LAB — BLOOD GAS, ARTERIAL
ACID-BASE DEFICIT: 0.9 mmol/L (ref 0.0–2.0)
BICARBONATE: 22.5 mmol/L (ref 20.0–28.0)
DRAWN BY: 449841
FIO2: 21
O2 SAT: 91.9 %
PATIENT TEMPERATURE: 98.6
PCO2 ART: 32.6 mmHg (ref 32.0–48.0)
PH ART: 7.454 — AB (ref 7.350–7.450)
PO2 ART: 60.4 mmHg — AB (ref 83.0–108.0)

## 2016-08-27 LAB — PULMONARY FUNCTION TEST
DL/VA % PRED: 100 %
DL/VA: 4.51 ml/min/mmHg/L
DLCO unc % pred: 92 %
DLCO unc: 27.31 ml/min/mmHg
FEF 25-75 POST: 2 L/s
FEF 25-75 Pre: 2.12 L/sec
FEF2575-%Change-Post: -5 %
FEF2575-%PRED-POST: 76 %
FEF2575-%Pred-Pre: 81 %
FEV1-%CHANGE-POST: 0 %
FEV1-%PRED-PRE: 93 %
FEV1-%Pred-Post: 93 %
FEV1-PRE: 3.04 L
FEV1-Post: 3.03 L
FEV1FVC-%Change-Post: 3 %
FEV1FVC-%PRED-PRE: 96 %
FEV6-%Change-Post: -3 %
FEV6-%Pred-Post: 98 %
FEV6-%Pred-Pre: 102 %
FEV6-POST: 4.05 L
FEV6-PRE: 4.21 L
FEV6FVC-%Change-Post: 0 %
FEV6FVC-%PRED-POST: 105 %
FEV6FVC-%Pred-Pre: 105 %
FVC-%CHANGE-POST: -3 %
FVC-%PRED-POST: 93 %
FVC-%PRED-PRE: 97 %
FVC-POST: 4.05 L
FVC-PRE: 4.21 L
POST FEV1/FVC RATIO: 75 %
PRE FEV6/FVC RATIO: 100 %
Post FEV6/FVC ratio: 100 %
Pre FEV1/FVC ratio: 72 %
RV % pred: 60 %
RV: 1.33 L
TLC % pred: 86 %
TLC: 5.69 L

## 2016-08-27 LAB — VAS US DOPPLER PRE CABG
LCCAPDIAS: 11 cm/s
LCCAPSYS: 98 cm/s
LEFT ECA DIAS: -15 cm/s
LEFT VERTEBRAL DIAS: -12 cm/s
LICADSYS: -72 cm/s
LICAPSYS: -64 cm/s
Left ICA dist dias: -19 cm/s
Left ICA prox dias: -19 cm/s
RCCAPDIAS: -12 cm/s
RCCAPSYS: -113 cm/s
RIGHT ECA DIAS: -14 cm/s
RIGHT VERTEBRAL DIAS: -7 cm/s
Right cca dist sys: -70 cm/s

## 2016-08-27 LAB — ABO/RH: ABO/RH(D): A POS

## 2016-08-27 LAB — PROTIME-INR
INR: 1.02
PROTHROMBIN TIME: 13.4 s (ref 11.4–15.2)

## 2016-08-27 LAB — APTT: aPTT: 32 seconds (ref 24–36)

## 2016-08-27 LAB — SURGICAL PCR SCREEN
MRSA, PCR: NEGATIVE
STAPHYLOCOCCUS AUREUS: NEGATIVE

## 2016-08-27 MED ORDER — ALBUTEROL SULFATE (2.5 MG/3ML) 0.083% IN NEBU
2.5000 mg | INHALATION_SOLUTION | Freq: Once | RESPIRATORY_TRACT | Status: AC
  Administered 2016-08-27: 2.5 mg via RESPIRATORY_TRACT

## 2016-08-27 NOTE — Progress Notes (Signed)
Pre-op Cardiac Surgery  Carotid Findings:  No evidence of ICA stenosis. Vertebral artery flow is antegrade  Upper Extremity Right Left  Brachial Pressures 134 Triphasic 134 Triphasic  Radial Waveforms Triphasic Triphasic  Ulnar Waveforms Triphasic Triphasic  Palmar Arch (Allen's Test) Normal Normal   Findings:  Doppler waveforms remained normal bilaterally with both radial and ulnar compressions.   [facility], [place] 08/27/16 12:35 PM

## 2016-08-27 NOTE — Pre-Procedure Instructions (Signed)
    Ronald Gomez  08/27/2016      [id number] - [place], [place] - [address] AT [address] [place] [place] [address] Phone: [phone] Fax: [phone]    Your procedure is scheduled on 08-29-2016   Thursday .  Report to [facility] Admitting at 5:30 AM    Call this number if you have problems the morning of surgery:  [phone]   Remember:  Do not eat food or drink liquids after midnight.   Take these medicines the morning of surgery with A SIP OF WATER Amlodipine(Norvasc),Atorvastatin(Lipitor),Buspirone(Buspar),Metoprolol(TopriolXR),     Do not wear jewelry,  Do not wear lotions, powders, or perfumes, or deoderant.  Do not shave 48 hours prior to surgery.  Men may shave face and neck.   Do not bring valuables to the hospital.  [facility] is not responsible for any belongings or valuables.  Contacts, dentures or bridgework may not be worn into surgery.  Leave your suitcase in the car.  After surgery it may be brought to your room.  For patients admitted to the hospital, discharge time will be determined by your treatment team.  Patients discharged the day of surgery will not be allowed to drive home.    Special instructions:  See Attached sheet for instructions on [facility]

## 2016-08-28 ENCOUNTER — Encounter (HOSPITAL_COMMUNITY): Payer: Self-pay | Admitting: Certified Registered Nurse Anesthetist

## 2016-08-28 LAB — HEMOGLOBIN A1C
Hgb A1c MFr Bld: 5.7 % — ABNORMAL HIGH (ref 4.8–5.6)
Mean Plasma Glucose: 117 mg/dL

## 2016-08-28 MED ORDER — DEXMEDETOMIDINE HCL IN NACL 400 MCG/100ML IV SOLN
0.1000 ug/kg/h | INTRAVENOUS | Status: AC
  Administered 2016-08-29: .3 ug/kg/h via INTRAVENOUS
  Filled 2016-08-28: qty 100

## 2016-08-28 MED ORDER — TRANEXAMIC ACID 1000 MG/10ML IV SOLN
1.5000 mg/kg/h | INTRAVENOUS | Status: AC
  Administered 2016-08-29: 1.5 mg/kg/h via INTRAVENOUS
  Filled 2016-08-28: qty 25

## 2016-08-28 MED ORDER — TRANEXAMIC ACID (OHS) PUMP PRIME SOLUTION
2.0000 mg/kg | INTRAVENOUS | Status: DC
Start: 2016-08-29 — End: 2016-08-29
  Filled 2016-08-28: qty 1.48

## 2016-08-28 MED ORDER — SODIUM CHLORIDE 0.9 % IV SOLN
INTRAVENOUS | Status: DC
  Filled 2016-08-28: qty 30

## 2016-08-28 MED ORDER — CHLORHEXIDINE GLUCONATE 0.12 % MT SOLN
15.0000 mL | Freq: Once | OROMUCOSAL | Status: AC
  Administered 2016-08-29: 15 mL via OROMUCOSAL
  Filled 2016-08-28: qty 15

## 2016-08-28 MED ORDER — SODIUM CHLORIDE 0.9 % IV SOLN
INTRAVENOUS | Status: AC
  Administered 2016-08-29: 1.1 [IU]/h via INTRAVENOUS
  Filled 2016-08-28: qty 2.5

## 2016-08-28 MED ORDER — NITROGLYCERIN IN D5W 200-5 MCG/ML-% IV SOLN
2.0000 ug/min | INTRAVENOUS | Status: DC
  Filled 2016-08-28: qty 250

## 2016-08-28 MED ORDER — MAGNESIUM SULFATE 50 % IJ SOLN
40.0000 meq | INTRAMUSCULAR | Status: DC
  Filled 2016-08-28: qty 10

## 2016-08-28 MED ORDER — CEFUROXIME SODIUM 1.5 G IJ SOLR
1.5000 g | INTRAMUSCULAR | Status: AC
  Administered 2016-08-29: 1.5 g via INTRAVENOUS
  Administered 2016-08-29: .75 g via INTRAVENOUS
  Filled 2016-08-28: qty 1.5

## 2016-08-28 MED ORDER — DOPAMINE-DEXTROSE 3.2-5 MG/ML-% IV SOLN
0.0000 ug/kg/min | INTRAVENOUS | Status: DC
  Filled 2016-08-28: qty 250

## 2016-08-28 MED ORDER — VANCOMYCIN HCL 10 G IV SOLR
1250.0000 mg | INTRAVENOUS | Status: AC
  Administered 2016-08-29: 1250 mg via INTRAVENOUS
  Filled 2016-08-28: qty 1250

## 2016-08-28 MED ORDER — METOPROLOL TARTRATE 12.5 MG HALF TABLET
12.5000 mg | ORAL_TABLET | Freq: Once | ORAL | Status: DC
  Filled 2016-08-28: qty 1

## 2016-08-28 MED ORDER — PHENYLEPHRINE HCL 10 MG/ML IJ SOLN
30.0000 ug/min | INTRAVENOUS | Status: DC
  Filled 2016-08-28: qty 2

## 2016-08-28 MED ORDER — POTASSIUM CHLORIDE 2 MEQ/ML IV SOLN
80.0000 meq | INTRAVENOUS | Status: DC
  Filled 2016-08-28: qty 40

## 2016-08-28 MED ORDER — DEXTROSE 5 % IV SOLN
0.0000 ug/min | INTRAVENOUS | Status: DC
  Filled 2016-08-28: qty 4

## 2016-08-28 MED ORDER — TRANEXAMIC ACID (OHS) BOLUS VIA INFUSION
15.0000 mg/kg | INTRAVENOUS | Status: AC
  Administered 2016-08-29: 1108.5 mg via INTRAVENOUS
  Filled 2016-08-28: qty 1109

## 2016-08-28 MED ORDER — PLASMA-LYTE 148 IV SOLN
INTRAVENOUS | Status: DC
  Filled 2016-08-28: qty 2.5

## 2016-08-28 MED ORDER — DEXTROSE 5 % IV SOLN
750.0000 mg | INTRAVENOUS | Status: DC
  Filled 2016-08-28: qty 750

## 2016-08-28 NOTE — Anesthesia Preprocedure Evaluation (Addendum)
Anesthesia Evaluation  Patient identified by MRN, date of birth, ID band Patient awake    Reviewed: Allergy & Precautions, NPO status , Patient's Chart, lab work & pertinent test results  History of Anesthesia Complications Negative for: history of anesthetic complications  Airway Mallampati: II  TM Distance: >3 FB Neck ROM: Full    Dental  (+) Teeth Intact, Dental Advisory Given   Pulmonary shortness of breath, former smoker,    breath sounds clear to auscultation       Cardiovascular hypertension, + Valvular Problems/Murmurs AS  Rhythm:Regular Rate:Normal + Systolic murmurs Echo noted preserved LV function. Significant AS Plan TEE intraop   Neuro/Psych Anxiety negative psych ROS   GI/Hepatic negative GI ROS, (+)     substance abuse  alcohol use,   Endo/Other  negative endocrine ROS  Renal/GU negative Renal ROS     Musculoskeletal   Abdominal   Peds  Hematology negative hematology ROS (+)   Anesthesia Other Findings   Reproductive/Obstetrics                          Anesthesia Physical Anesthesia Plan  ASA: III  Anesthesia Plan: General   Post-op Pain Management:    Induction: Intravenous  Airway Management Planned: Oral ETT  Additional Equipment: Arterial line, PA Cath, Ultrasound Guidance Line Placement and 3D TEE  Intra-op Plan:   Post-operative Plan: Post-operative intubation/ventilation  Informed Consent: I have reviewed the patients History and Physical, chart, labs and discussed the procedure including the risks, benefits and alternatives for the proposed anesthesia with the patient or authorized representative who has indicated his/her understanding and acceptance.   Dental advisory given  Plan Discussed with: CRNA  Anesthesia Plan Comments:        Anesthesia Quick Evaluation

## 2016-08-28 NOTE — H&P (Signed)
[address]       [place] [address]             [phone]      Cardiothoracic Surgery History and Physical   PCP is [name], MD Referring Provider is [name], MD      Chief Complaint  Patient presents with  . 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 Review of his echo reports in [place] showed a mean gradient of 14 mm Hg in 01/2011 with aortic valve sclerosis and a mean gradient of 15 mm Hg in 03/2013 with some calcification and thickening of the aortic valve. His first echo here in 11/2014 showed a possible bicuspid valve with moderately thickened and calcified leaflets with a mean gradient of 21 mm Hg and normal LV function. His most recent echo on 12/20/2015 shows an increase in the mean gradient to 35 mm Hg with a dimensionless index of 0.22. The AVA index was 0.37 cm2/m2.The leaflets are severely calcified. LV function is normal with mild LVH and normal LV dimensions. The aorta is normal sized. He had an exercise tolerance test on 12/25/2015 which showed ST depression during stress in II, III, aVF, V5 and V6 leads, beginning at 6 minutes of stress, ending at 10 minutes of stress, and returning to baseline after 5-9 minutes of recovery. He subsequently underwent cardiac cath on 12/25/2015 which showed no significant coronary disease and moderate AS with a mean gradient of 33 mm Hg with a calculated AVA of 1.2 cm2.  He remains very active exercising vigorously several days per week. He has not been limited in what he can do.      Past Medical History  Diagnosis Date  . Hypertension           Past Surgical History  Procedure Laterality Date  . Supravalvular aortic stenosis repair N/A 2006  . Cardiac catheterization N/A 12/25/2015    Procedure:  Right/Left Heart Cath and Coronary Angiography;  Surgeon: Jettie Booze, MD;  Location: [facility];  Service: Cardiovascular;  Laterality: N/A;         Family History  Problem Relation Age of Onset  . Hypertension Mother   . Heart disease Mother   . CAD Mother   . Cardiomyopathy Mother   . Diabetes Mother   . Brain cancer Father   . Colon cancer Neg Hx   . Heart attack Neg Hx   . Stroke Neg Hx     Social History       Social History  Substance Use Topics  . Smoking status: Never Smoker   . Smokeless tobacco: Never Used  . Alcohol Use: 0.0 oz/week    0 Standard drinks or equivalent per week     Comment: occasionally           Current Outpatient Prescriptions  Medication Sig Dispense Refill  . amLODipine (NORVASC) 10 MG tablet TAKE 1 TABLET (10 MG TOTAL) BY MOUTH DAILY. 90 tablet 3  . aspirin 81 MG tablet Take 81 mg by mouth daily.    Marland Kitchen atorvastatin (LIPITOR) 10 MG tablet Take 1 tablet (10 mg total) by mouth daily. 90 tablet 3  . losartan (COZAAR) 25 MG tablet Take 1 tablet (  25 mg total) by mouth daily. 90 tablet 3  . metoprolol succinate (TOPROL-XL) 25 MG 24 hr tablet Take 1 tablet (25 mg total) by mouth 2 (two) times daily. 180 tablet 3  . Omega-3 Fatty Acids (FISH OIL) 1000 MG CAPS Take 1,000 mg by mouth daily.    . vitamin C (ASCORBIC ACID) 500 MG tablet Take 500 mg by mouth daily.     No current facility-administered medications for this visit.         Allergies  Allergen Reactions  . Lisinopril     Rash and lip tingling    Review of Systems  Constitutional: Negative for activity change, appetite change, fatigue and unexpected weight change.  HENT: Negative.   Eyes: Negative.   Respiratory: Negative for shortness of breath.   Cardiovascular: Negative for chest pain, palpitations and leg swelling.  Endocrine: Negative.   Genitourinary: Negative.   Musculoskeletal: Negative.   Skin: Negative.     Allergic/Immunologic: Negative.   Neurological: Negative.   Hematological: Negative.   Psychiatric/Behavioral: Negative.     BP 137/83 mmHg  Pulse 79  Resp 20  Ht 5\' 8"  (1.727 m)  Wt 161 lb (73.029 kg)  BMI 24.49 kg/m2  SpO2 98% Physical Exam  Constitutional: He is oriented to person, place, and time. He appears well-developed and well-nourished. No distress.  HENT:  Head: Normocephalic and atraumatic.  Eyes: EOM are normal. Pupils are equal, round, and reactive to light.  Neck: Normal range of motion. Neck supple. No JVD present.  Cardiovascular: Normal rate, regular rhythm and intact distal pulses.   Murmur heard. 3/6 systolic murmur along RSB  Pulmonary/Chest: Effort normal and breath sounds normal.  Abdominal: Soft. He exhibits no distension. There is no tenderness.  Musculoskeletal: He exhibits no edema.  Neurological: He is alert and oriented to person, place, and time.  Skin: Skin is warm and dry.  Psychiatric: He has a normal mood and affect.     Diagnostic Tests:   [facility]  [address], [place] [address]  [phone]  ------------------------------------------------------------------- Transthoracic Echocardiography  Patient: Saketh, Ferrand MR #: [id number] Study Date: 12/20/2015 Gender: M Age: 65 Height: 172.7 cm Weight: 73.9 kg BSA: 1.89 m^2 Pt. Status: Room:  SONOGRAPHER Marygrace Drought, RCS ATTENDING [place], [place], Out[facility]  cc:  ------------------------------------------------------------------- LV EF: 55% - 60%  ------------------------------------------------------------------- Study Conclusions  - Left ventricle: The cavity size was mildly dilated. Wall  thickness was increased in a pattern  of mild LVH. Systolic  function was normal. The estimated ejection fraction was in the  range of 55% to 60%. - Aortic valve: Mean gradient has increased from 21 mmHg to 35 mmHg  since last echo Cannot r/o bicuspid morphology. There was  moderate to severe stenosis. There was trivial regurgitation. - Left atrium: The atrium was mildly dilated. - Right atrium: The atrium was mildly dilated. - Atrial septum: No defect or patent foramen ovale was identified.  Transthoracic echocardiography. M-mode, complete 2D, spectral Doppler, and color Doppler. Birthdate: Patient birthdate: [birth year]. Age: Patient is 65 yr old. Sex: Gender: male. BMI: 24.8 kg/m^2. Blood pressure: 158/95 Patient status: Outpatient. Study date: Study date: 12/20/2015. Study time: 07:47 AM. Location: [facility]  -------------------------------------------------------------------  ------------------------------------------------------------------- Left ventricle: The cavity size was mildly dilated. Wall thickness was increased in a pattern of mild LVH. Systolic function was normal. The estimated ejection fraction was in the range of  55% to 60%.  ------------------------------------------------------------------- Aortic valve: Mean gradient has increased from 21 mmHg to 35 mmHg since last echo Cannot r/o bicuspid morphology. Severely calcified leaflets. Doppler: There was moderate to severe stenosis. There was trivial regurgitation. VTI ratio of LVOT to aortic valve: 0.24. Valve area (VTI): 0.76 cm^2. Indexed valve area (VTI): 0.4 cm^2/m^2. Peak velocity ratio of LVOT to aortic valve: 0.22. Valve area (Vmax): 0.7 cm^2. Indexed valve area (Vmax): 0.37 cm^2/m^2. Mean velocity ratio of LVOT to aortic valve: 0.21. Valve area (Vmean): 0.67 cm^2. Indexed valve area (Vmean): 0.35 cm^2/m^2.  Mean gradient (S): 35 mm Hg. Peak gradient (S): 58 mm  Hg.  ------------------------------------------------------------------- Aorta: The aorta was normal, not dilated, and non-diseased.  ------------------------------------------------------------------- Mitral valve: Mildly thickened leaflets . Doppler: There was trivial regurgitation. Peak gradient (D): 3 mm Hg.  ------------------------------------------------------------------- Left atrium: The atrium was mildly dilated.  ------------------------------------------------------------------- Atrial septum: No defect or patent foramen ovale was identified.  ------------------------------------------------------------------- Pulmonic valve: Doppler: There was mild regurgitation.  ------------------------------------------------------------------- Tricuspid valve: Doppler: There was mild regurgitation.  ------------------------------------------------------------------- Right atrium: The atrium was mildly dilated.  ------------------------------------------------------------------- Pericardium: The pericardium was normal in appearance.  ------------------------------------------------------------------- Systemic veins: Inferior vena cava: The vessel was dilated. The respirophasic diameter changes were in the normal range (= 50%), consistent with normal central venous pressure. Diameter: 23 mm.  ------------------------------------------------------------------- Post procedure conclusions Ascending Aorta:  - The aorta was normal, not dilated, and non-diseased.  ------------------------------------------------------------------- Measurements  IVC Value Reference ID 23 mm ---------  Left ventricle Value Reference LV ID, ED, PLAX chordal 47.2 mm 43 - 52 LV ID, ES, PLAX  chordal 31.7 mm 23 - 38 LV fx shortening, PLAX chordal 33 % >=29 LV PW thickness, ED 12.9 mm --------- IVS/LV PW ratio, ED 0.92 <=1.3 Stroke volume, 2D 72 ml --------- Stroke volume/bsa, 2D 38 ml/m^2 --------- LV ejection fraction, 1-p A4C 67 % --------- LV e&', lateral 7.07 cm/s --------- LV E/e&', lateral 11.6 --------- LV e&', medial 7.51 cm/s --------- LV E/e&', medial 10.92 --------- LV e&', average 7.29 cm/s --------- LV E/e&', average 11.25 ---------  Ventricular septum Value Reference IVS thickness, ED 11.9 mm ---------  LVOT Value Reference LVOT ID, S 20 mm --------- LVOT area 3.14 cm^2 --------- LVOT peak velocity, S 84 cm/s --------- LVOT mean velocity, S 59 cm/s --------- LVOT VTI, S 22.9 cm ---------  Aortic valve Value Reference Aortic valve peak velocity, S 379 cm/s --------- Aortic valve mean velocity, S 277 cm/s --------- Aortic valve VTI, S 94.5 cm --------- Aortic mean gradient, S 35 mm Hg --------- Aortic peak gradient, S 58 mm Hg --------- VTI ratio, LVOT/AV 0.24 --------- Aortic valve area,  VTI 0.76 cm^2 --------- Aortic valve area/bsa, VTI 0.4 cm^2/m^2 --------- Velocity ratio, peak, LVOT/AV 0.22 --------- Aortic valve area, peak velocity 0.7 cm^2 --------- Aortic valve area/bsa, peak 0.37 cm^2/m^2 --------- velocity Velocity ratio, mean, LVOT/AV 0.21 --------- Aortic valve area, mean velocity 0.67 cm^2 --------- Aortic valve area/bsa, mean 0.35 cm^2/m^2 --------- velocity Aortic regurg pressure half-time 554 ms ---------  Aorta Value Reference Aortic root ID, ED 31 mm ---------  Left atrium Value Reference LA ID, A-P, ES 42 mm --------- LA ID/bsa, A-P (H) 2.22 cm/m^2 <=2.2 LA volume, S 55.8 ml --------- LA volume/bsa, S 29.5 ml/m^2 --------- LA volume, ES, 1-p A4C 42.5 ml --------- LA volume/bsa, ES, 1-p A4C 22.5 ml/m^2 --------- LA volume, ES, 1-p A2C 60.7 ml --------- LA volume/bsa, ES, 1-p A2C 32.1 ml/m^2 ---------  Mitral valve Value Reference Mitral E-wave peak  velocity 82 cm/s --------- Mitral A-wave peak velocity 91.2 cm/s --------- Mitral deceleration time (H) 236 ms 150 - 230 Mitral peak gradient, D 3 mm Hg --------- Mitral E/A ratio, peak 0.9 ---------  Tricuspid valve Value Reference Tricuspid regurg peak velocity 229 cm/s  --------- Tricuspid peak RV-RA gradient 21 mm Hg ---------  Right ventricle Value Reference TAPSE 26.3 mm --------- RV s&', lateral, S 11 cm/s ---------  Legend: (L) and (H) mark values outside specified reference range.  ------------------------------------------------------------------- Prepared and Electronically Authenticated by  Jenkins Rouge, M.D. 2017-02-01T12:25:47       [facility]                        [address], [place] [address]                            [phone]  ------------------------------------------------------------------- Transthoracic Echocardiography  Patient:    Kaion, Arminio MR #:       [id number] Study Date: 06/19/2016 Gender:     M Age:        59 Height:     172.7 cm Weight:     73 kg BSA:        1.88 m^2 Pt. Status: Room:   ORDERING     Gilford Raid, MD  REFERRING    Gilford Raid, MD  SONOGRAPHER  [facility] Mage, [place], Outpatient  ATTENDING    Skeet Latch, MD  cc:  ------------------------------------------------------------------- LV EF: 60% -   65%  ------------------------------------------------------------------- Indications:      Aortic stenosis (I35.0).  ------------------------------------------------------------------- History:   PMH:   Aortic valve disease.  Risk factors: Hypertension. Dyslipidemia.  ------------------------------------------------------------------- Study Conclusions  - Left ventricle: The cavity size was normal. There was mild   concentric hypertrophy. Systolic function was normal. The   estimated ejection fraction was in the range of 60% to 65%. Wall   motion was normal; there were no regional wall motion   abnormalities. Left ventricular diastolic function parameters   were normal. -  Aortic valve: Valve mobility was restricted. There was severe   stenosis. There was moderate regurgitation. - Mitral valve: Transvalvular velocity was within the normal range.   There was no evidence for stenosis. There was no regurgitation. - Left atrium: The atrium was moderately dilated. - Right ventricle: The cavity size was normal. Wall thickness was   normal. Systolic function was normal. - Tricuspid valve: There was trivial regurgitation. - Pulmonary arteries: Systolic pressure was within the normal   range. PA peak pressure: 42 mm Hg (S).  Impressions:  - Mean aortic valve gradient has increased from 35 mmHg to 47 mmHg   since 12/2015. The aortic stenosis is now severe.  ------------------------------------------------------------------- Labs, prior tests, procedures, and surgery: Transthoracic echocardiography (12/20/2015).    The aortic valve showed moderate to severe stenosis.  EF was 60%. Aortic valve: peak gradient of 58 mm Hg and mean gradient of 35 mm Hg.  ------------------------------------------------------------------- Study data:  Comparison was made to the study of 12/20/2015.  Study status:  Routine.  Procedure:  The patient reported no pain pre or post test. Transthoracic echocardiography. Image quality was adequate.  Study completion:  There were no complications. Transthoracic echocardiography.  M-mode, complete 2D, spectral Doppler, and  color Doppler.  Birthdate:  Patient birthdate: 06-13-1951.  Age:  Patient is 65 yr old.  Sex:  Gender: male. BMI: 24.5 kg/m^2.  Blood pressure:     128/88  Patient status: Outpatient.  Study date:  Study date: 06/19/2016. Study time: 01:04 PM.  Location:  [facility]  -------------------------------------------------------------------  ------------------------------------------------------------------- Left ventricle:  The cavity size was normal. There was mild concentric hypertrophy. Systolic function was  normal. The estimated ejection fraction was in the range of 60% to 65%. Wall motion was normal; there were no regional wall motion abnormalities. The transmitral flow pattern was normal. The deceleration time of the early transmitral flow velocity was normal. The pulmonary vein flow pattern was normal. The tissue Doppler parameters were normal. Left ventricular diastolic function parameters were normal.  ------------------------------------------------------------------- Aortic valve:   Trileaflet; moderately thickened, moderately calcified leaflets. Valve mobility was restricted.  Doppler: There was severe stenosis.   There was moderate regurgitation. VTI ratio of LVOT to aortic valve: 0.23. Valve area (VTI): 0.89 cm^2. Indexed valve area (VTI): 0.47 cm^2/m^2. Peak velocity ratio of LVOT to aortic valve: 0.21. Valve area (Vmax): 0.78 cm^2. Indexed valve area (Vmax): 0.42 cm^2/m^2. Mean velocity ratio of LVOT to aortic valve: 0.2. Valve area (Vmean): 0.77 cm^2. Indexed valve area (Vmean): 0.41 cm^2/m^2.    Mean gradient (S): 47 mm Hg. Peak gradient (S): 75 mm Hg.  ------------------------------------------------------------------- Aorta:  Aortic root: The aortic root was normal in size.  ------------------------------------------------------------------- Mitral valve:   Structurally normal valve.   Mobility was not restricted.  Doppler:  Transvalvular velocity was within the normal range. There was no evidence for stenosis. There was no regurgitation.    Peak gradient (D): 4 mm Hg.  ------------------------------------------------------------------- Left atrium:  The atrium was moderately dilated.  ------------------------------------------------------------------- Right ventricle:  The cavity size was normal. Wall thickness was normal. Systolic function was normal.  ------------------------------------------------------------------- Pulmonic valve:    Structurally normal  valve.   Cusp separation was normal.  Doppler:  Transvalvular velocity was within the normal range. There was no evidence for stenosis. There was no regurgitation.  ------------------------------------------------------------------- Tricuspid valve:   Structurally normal valve.    Doppler: Transvalvular velocity was within the normal range. There was trivial regurgitation.  ------------------------------------------------------------------- Pulmonary artery:   The main pulmonary artery was normal-sized. Systolic pressure was within the normal range.  ------------------------------------------------------------------- Right atrium:  The atrium was normal in size.  ------------------------------------------------------------------- Pericardium:  There was no pericardial effusion.  ------------------------------------------------------------------- Systemic veins: Inferior vena cava: The vessel was normal in size. The respirophasic diameter changes were blunted (< 50%), consistent with normal central venous pressure.  ------------------------------------------------------------------- Measurements   Left ventricle                           Value           Reference  LV ID, ED, PLAX chordal                  50     mm       43 - 52  LV ID, ES, PLAX chordal                  32.7   mm       23 - 38  LV fx shortening, PLAX chordal           35     %        >=29  LV PW thickness, ED                      10.4   mm       ---------  IVS/LV PW ratio, ED                      1               <=1.3  Stroke volume, 2D                        88     ml       ---------  Stroke volume/bsa, 2D                    47     ml/m^2   ---------  LV e&', lateral                           10.7   cm/s     ---------  LV E/e&', lateral                         9.91            ---------  LV e&', medial                            7.54   cm/s     ---------  LV E/e&', medial                          14.06            ---------  LV e&', average                           9.12   cm/s     ---------  LV E/e&', average                         11.62           ---------    Ventricular septum                       Value           Reference  IVS thickness, ED                        10.4   mm       ---------    LVOT                                     Value           Reference  LVOT ID, S                               22     mm       ---------  LVOT area  3.8    cm^2     ---------  LVOT peak velocity, S                    89.2   cm/s     ---------  LVOT mean velocity, S                    64.9   cm/s     ---------  LVOT VTI, S                              23.1   cm       ---------    Aortic valve                             Value           Reference  Aortic valve peak velocity, S            432    cm/s     ---------  Aortic valve mean velocity, S            320    cm/s     ---------  Aortic valve VTI, S                      98.8   cm       ---------  Aortic mean gradient, S                  47     mm Hg    ---------  Aortic peak gradient, S                  75     mm Hg    ---------  VTI ratio, LVOT/AV                       0.23            ---------  Aortic valve area, VTI                   0.89   cm^2     ---------  Aortic valve area/bsa, VTI               0.47   cm^2/m^2 ---------  Velocity ratio, peak, LVOT/AV            0.21            ---------  Aortic valve area, peak velocity         0.78   cm^2     ---------  Aortic valve area/bsa, peak              0.42   cm^2/m^2 ---------  velocity  Velocity ratio, mean, LVOT/AV            0.2             ---------  Aortic valve area, mean velocity         0.77   cm^2     ---------  Aortic valve area/bsa, mean              0.41   cm^2/m^2 ---------  velocity  Aortic regurg peak velocity              219.48 cm/s     ---------  Aortic regurg  deceleration               159    cm/s^2   ---------  Aortic regurg deceleration time           1376   ms       ---------  Aortic regurg pressure half-time         399    ms       ---------  Aortic regurg peak gradient              19     mm Hg    ---------    Aorta                                    Value           Reference  Aortic root ID, ED                       35     mm       ---------  Ascending aorta ID, A-P, S               34     mm       ---------    Left atrium                              Value           Reference  LA ID, A-P, ES                           40     mm       ---------  LA ID/bsa, A-P                           2.13   cm/m^2   <=2.2  LA volume, S                             70     ml       ---------  LA volume/bsa, S                         37.2   ml/m^2   ---------  LA volume, ES, 1-p A4C                   53     ml       ---------  LA volume/bsa, ES, 1-p A4C               28.2   ml/m^2   ---------  LA volume, ES, 1-p A2C                   78     ml       ---------  LA volume/bsa, ES, 1-p A2C               41.5   ml/m^2   ---------    Mitral valve                             Value  Reference  Mitral E-wave peak velocity              106    cm/s     ---------  Mitral A-wave peak velocity              78.5   cm/s     ---------  Mitral deceleration time                 218    ms       150 - 230  Mitral peak gradient, D                  4      mm Hg    ---------  Mitral E/A ratio, peak                   1.4             ---------    Pulmonary arteries                       Value           Reference  PA pressure, S, DP               (H)     42     mm Hg    <=30    Tricuspid valve                          Value           Reference  Tricuspid regurg peak velocity           290    cm/s     ---------  Tricuspid peak RV-RA gradient            34     mm Hg    ---------    Systemic veins                           Value           Reference  Estimated CVP                            8      mm Hg    ---------    Right ventricle                           Value           Reference  RV pressure, S, DP               (H)     42     mm Hg    <=30  RV s&', lateral, S                        14.4   cm/s     ---------  Legend: (L)  and  (H)  mark values outside specified reference range.  ------------------------------------------------------------------- Prepared and Electronically Authenticated by  Skeet Latch, MD 2017-08-02T16:25:37           Jettie Booze, MD (Primary)          Procedures      Right/Left Heart Cath and Coronary Angiography        Conclusion  The left ventricular systolic function is normal.  Moderate aortic stenosis with calculated valve area of 1.2 cm. Mean gradient 33 mm Hg. Cardiac output 5.3 L/m. Cardiac index 2.8. Pulmonary artery saturation 74%.  Normal right heart pressures.  No significant coronary artery disease.  Continue with preventive therapy. Continue regular exercise and watch for worsening sx of aortic stenosis. Will cc: Dr. Cyndia Bent per the patient's request.          Indications      Abnormal stress test [R94.39 (ICD-10-CM)]     Aortic stenosis [I35.0 (ICD-10-CM)]        Technique and Indications      The risks, benefits, and details of the procedure were explained to the patient. The patient verbalized understanding and wanted to proceed. Informed written consent was obtained.  PROCEDURE TECHNIQUE: After Xylocaine anesthesia, a 5 French brachial sheath was placed in the right AC area exchanged for a peripheral IV. A 5 French balloontipped Swan-Ganz catheter was advanced to the pulmonary artery under fluoroscopic guidance, using a 0.25 glide wire. Hemodynamic pressures were obtained. Oxygen saturations were obtained. After Xylocaine anesthesia, a 1F sheath was placed in the right radial artery with a single anterior needle wall stick. Left coronary angiography was done using a Judkins 3.5 guide catheter. Right coronary angiography was done using a Judkins R4 guide  catheter. Left heart cath was done using a pigtail catheter. We were able to cross the aortic valve with a J-tip wire in the JR4 catheter. The pigtail catheter was then placed into the LV over the wire.  We tried to obtain simultaneous pressures from the right radial sheath and the LV catheter but due to a kink in the sheath, we were unable to get this pressure. Calculations were done using a pullback. A straight wire was not required.    Contrast: 80 cc  Estimated blood loss <50 mL. There were no immediate complications during the procedure.        Coronary Findings      Dominance: Right         Left Anterior Descending   . Mid LAD lesion, 10% stenosed.           Right Heart Pressures LV EDP is normal. Normal pulmonary artery pressures        Wall Motion                      Left Heart      Left Ventricle The left ventricular size is normal. The left ventricular systolic function is normal. The left ventricular ejection fraction is 55-65% by visual estimate. There are no wall motion abnormalities in the left ventricle.     Aortic Valve There is moderate aortic valve stenosis. The aortic valve is calcified.        Coronary Diagrams         Diagnostic Diagram              Implants       No implant documentation for this case.           PACS Images    Show images for Cardiac catheterization        Link to Procedure Log    Procedure Log        Hemo Data         Most Recent Value     Fick Cardiac Output  5.35 L/min     Fick Cardiac Output Index  2.89 (L/min)/BSA  Aortic Mean Gradient  33.6 mmHg     Aortic Peak Gradient  30 mmHg     Aortic Valve Area  1.26     Aortic Value Area Index  0.68 cm2/BSA     RA A Wave  3 mmHg     RA V Wave  1 mmHg     RA Mean  0 mmHg     RV Systolic Pressure  19 mmHg     RV Diastolic Pressure  0 mmHg     RV EDP  5 mmHg     PA Systolic Pressure  21 mmHg     PA  Diastolic Pressure  3 mmHg     PA Mean  11 mmHg     PW A Wave  7 mmHg     PW V Wave  6 mmHg     PW Mean  6 mmHg     AO Systolic Pressure  123456 mmHg     AO Diastolic Pressure  63 mmHg     AO Mean  86 mmHg     LV Systolic Pressure  A999333 mmHg     LV Diastolic Pressure  2 mmHg     LV EDP  10 mmHg     Arterial Occlusion Pressure Extended Systolic Pressure  A999333 mmHg     Arterial Occlusion Pressure Extended Diastolic Pressure  64 mmHg     Arterial Occlusion Pressure Extended Mean Pressure  88 mmHg     Left Ventricular Apex Extended Systolic Pressure  123456 mmHg     Left Ventricular Apex Extended Diastolic Pressure  1 mmHg     Left Ventricular Apex Extended EDP Pressure  8 mmHg     QP/QS  1     TPVR Index  4.5 HRUI     TSVR Index  29.74 HRUI     TPVR/TSVR Ratio  0.15           Impression:  He has continued progression of aortic stenosis into the severe range with a rise in the mean gradient to 42-47 mm Hg. Ventricular function is normal. He still has very good exercise tolerance but is clearly symptomatic compared to last year. I think AVR is indicated at this time.  I discussed the pros and cons of mechanical and tissue valves with him and his wife and he would like to use a pericardial valve which I think is a good choice at his age. I discussed the operative procedure with the patient and his wife including alternatives, benefits and risks; including but not limited to bleeding, blood transfusion, infection, stroke, myocardial infarction, graft failure, heart block requiring a permanent pacemaker, organ dysfunction, and death.  Tawni Pummel understands and agrees to proceed.    Plan:  We will plan on AVR using a pericardial valve on Thursday 08/29/2016.  Gaye Pollack, MD [facility] [phone]

## 2016-08-29 ENCOUNTER — Encounter (HOSPITAL_COMMUNITY): Payer: Self-pay | Admitting: *Deleted

## 2016-08-29 ENCOUNTER — Inpatient Hospital Stay (HOSPITAL_COMMUNITY): Payer: BLUE CROSS/BLUE SHIELD

## 2016-08-29 ENCOUNTER — Other Ambulatory Visit (HOSPITAL_COMMUNITY): Payer: Self-pay

## 2016-08-29 ENCOUNTER — Inpatient Hospital Stay (HOSPITAL_COMMUNITY): Payer: BLUE CROSS/BLUE SHIELD | Admitting: Certified Registered Nurse Anesthetist

## 2016-08-29 ENCOUNTER — Encounter (HOSPITAL_COMMUNITY)
Admission: RE | Disposition: A | Discharge status: Home / Self Care | Payer: Self-pay | Source: Ambulatory Visit | Attending: Surgery

## 2016-08-29 ENCOUNTER — Inpatient Hospital Stay (HOSPITAL_COMMUNITY)
Admission: RE | Admit: 2016-08-29 | Discharge: 2016-09-03 | DRG: 220 | Disposition: A | Discharge status: Home / Self Care | Payer: BLUE CROSS/BLUE SHIELD | Source: Ambulatory Visit | Attending: Surgery | Admitting: Surgery

## 2016-08-29 DIAGNOSIS — I350 Nonrheumatic aortic (valve) stenosis: Principal | ICD-10-CM | POA: Diagnosis present

## 2016-08-29 DIAGNOSIS — D62 Acute posthemorrhagic anemia: Secondary | ICD-10-CM | POA: Diagnosis not present

## 2016-08-29 DIAGNOSIS — Z7982 Long term (current) use of aspirin: Secondary | ICD-10-CM

## 2016-08-29 DIAGNOSIS — F419 Anxiety disorder, unspecified: Secondary | ICD-10-CM | POA: Diagnosis present

## 2016-08-29 DIAGNOSIS — I10 Essential (primary) hypertension: Secondary | ICD-10-CM | POA: Diagnosis present

## 2016-08-29 DIAGNOSIS — I351 Nonrheumatic aortic (valve) insufficiency: Secondary | ICD-10-CM

## 2016-08-29 DIAGNOSIS — Q231 Congenital insufficiency of aortic valve: Secondary | ICD-10-CM

## 2016-08-29 DIAGNOSIS — E785 Hyperlipidemia, unspecified: Secondary | ICD-10-CM | POA: Diagnosis present

## 2016-08-29 DIAGNOSIS — I4891 Unspecified atrial fibrillation: Secondary | ICD-10-CM | POA: Diagnosis not present

## 2016-08-29 DIAGNOSIS — Z79899 Other long term (current) drug therapy: Secondary | ICD-10-CM

## 2016-08-29 DIAGNOSIS — Z952 Presence of prosthetic heart valve: Secondary | ICD-10-CM

## 2016-08-29 HISTORY — PX: TEE WITHOUT CARDIOVERSION: SHX5443

## 2016-08-29 HISTORY — PX: AORTIC VALVE REPLACEMENT: SHX41

## 2016-08-29 LAB — GLUCOSE, CAPILLARY
GLUCOSE-CAPILLARY: 122 mg/dL — AB (ref 65–99)
GLUCOSE-CAPILLARY: 122 mg/dL — AB (ref 65–99)
Glucose-Capillary: 132 mg/dL — ABNORMAL HIGH (ref 65–99)
Glucose-Capillary: 98 mg/dL (ref 65–99)
Glucose-Capillary: 122 mg/dL — ABNORMAL HIGH (ref 65–99)
Glucose-Capillary: 164 mg/dL — ABNORMAL HIGH (ref 65–99)
Glucose-Capillary: 125 mg/dL — ABNORMAL HIGH (ref 65–99)
Glucose-Capillary: 147 mg/dL — ABNORMAL HIGH (ref 65–99)
Glucose-Capillary: 192 mg/dL — ABNORMAL HIGH (ref 65–99)
Glucose-Capillary: 155 mg/dL — ABNORMAL HIGH (ref 65–99)

## 2016-08-29 LAB — POCT I-STAT, CHEM 8
BUN: 12 mg/dL (ref 6–20)
BUN: 10 mg/dL (ref 6–20)
BUN: 11 mg/dL (ref 6–20)
BUN: 10 mg/dL (ref 6–20)
BUN: 10 mg/dL (ref 6–20)
BUN: 9 mg/dL (ref 6–20)
CALCIUM ION: 1.1 mmol/L — AB (ref 1.15–1.40)
CHLORIDE: 100 mmol/L — AB (ref 101–111)
CHLORIDE: 99 mmol/L — AB (ref 101–111)
CHLORIDE: 95 mmol/L — AB (ref 101–111)
CHLORIDE: 97 mmol/L — AB (ref 101–111)
CHLORIDE: 101 mmol/L (ref 101–111)
CREATININE: 0.7 mg/dL (ref 0.61–1.24)
CREATININE: 0.6 mg/dL — AB (ref 0.61–1.24)
CREATININE: 0.6 mg/dL — AB (ref 0.61–1.24)
Calcium, Ion: 1.21 mmol/L (ref 1.15–1.40)
Calcium, Ion: 0.97 mmol/L — ABNORMAL LOW (ref 1.15–1.40)
Calcium, Ion: 1.19 mmol/L (ref 1.15–1.40)
Calcium, Ion: 1.06 mmol/L — ABNORMAL LOW (ref 1.15–1.40)
Calcium, Ion: 1.06 mmol/L — ABNORMAL LOW (ref 1.15–1.40)
Chloride: 98 mmol/L — ABNORMAL LOW (ref 101–111)
Creatinine, Ser: 0.6 mg/dL — ABNORMAL LOW (ref 0.61–1.24)
Creatinine, Ser: 0.7 mg/dL (ref 0.61–1.24)
Creatinine, Ser: 0.8 mg/dL (ref 0.61–1.24)
GLUCOSE: 119 mg/dL — AB (ref 65–99)
GLUCOSE: 141 mg/dL — AB (ref 65–99)
Glucose, Bld: 116 mg/dL — ABNORMAL HIGH (ref 65–99)
Glucose, Bld: 108 mg/dL — ABNORMAL HIGH (ref 65–99)
Glucose, Bld: 141 mg/dL — ABNORMAL HIGH (ref 65–99)
Glucose, Bld: 111 mg/dL — ABNORMAL HIGH (ref 65–99)
HCT: 26 % — ABNORMAL LOW (ref 39.0–52.0)
HEMATOCRIT: 37 % — AB (ref 39.0–52.0)
HEMATOCRIT: 30 % — AB (ref 39.0–52.0)
HEMATOCRIT: 33 % — AB (ref 39.0–52.0)
HEMATOCRIT: 26 % — AB (ref 39.0–52.0)
HEMATOCRIT: 34 % — AB (ref 39.0–52.0)
HEMOGLOBIN: 12.6 g/dL — AB (ref 13.0–17.0)
HEMOGLOBIN: 10.2 g/dL — AB (ref 13.0–17.0)
HEMOGLOBIN: 11.2 g/dL — AB (ref 13.0–17.0)
Hemoglobin: 8.8 g/dL — ABNORMAL LOW (ref 13.0–17.0)
Hemoglobin: 8.8 g/dL — ABNORMAL LOW (ref 13.0–17.0)
Hemoglobin: 11.6 g/dL — ABNORMAL LOW (ref 13.0–17.0)
POTASSIUM: 4 mmol/L (ref 3.5–5.1)
POTASSIUM: 4.1 mmol/L (ref 3.5–5.1)
POTASSIUM: 5.5 mmol/L — AB (ref 3.5–5.1)
POTASSIUM: 5.5 mmol/L — AB (ref 3.5–5.1)
Potassium: 4.4 mmol/L (ref 3.5–5.1)
Potassium: 4.3 mmol/L (ref 3.5–5.1)
SODIUM: 137 mmol/L (ref 135–145)
SODIUM: 132 mmol/L — AB (ref 135–145)
SODIUM: 135 mmol/L (ref 135–145)
SODIUM: 137 mmol/L (ref 135–145)
Sodium: 138 mmol/L (ref 135–145)
Sodium: 139 mmol/L (ref 135–145)
TCO2: 30 mmol/L (ref 0–100)
TCO2: 27 mmol/L (ref 0–100)
TCO2: 29 mmol/L (ref 0–100)
TCO2: 27 mmol/L (ref 0–100)
TCO2: 28 mmol/L (ref 0–100)
TCO2: 25 mmol/L (ref 0–100)

## 2016-08-29 LAB — POCT I-STAT 3, ART BLOOD GAS (G3+)
ACID-BASE EXCESS: 1 mmol/L (ref 0.0–2.0)
Acid-Base Excess: 4 mmol/L — ABNORMAL HIGH (ref 0.0–2.0)
Acid-Base Excess: 5 mmol/L — ABNORMAL HIGH (ref 0.0–2.0)
Acid-Base Excess: 2 mmol/L (ref 0.0–2.0)
Acid-Base Excess: 5 mmol/L — ABNORMAL HIGH (ref 0.0–2.0)
BICARBONATE: 29.6 mmol/L — AB (ref 20.0–28.0)
BICARBONATE: 26.5 mmol/L (ref 20.0–28.0)
BICARBONATE: 23.7 mmol/L (ref 20.0–28.0)
Bicarbonate: 30.5 mmol/L — ABNORMAL HIGH (ref 20.0–28.0)
Bicarbonate: 28.4 mmol/L — ABNORMAL HIGH (ref 20.0–28.0)
O2 Saturation: 100 %
O2 Saturation: 100 %
O2 Saturation: 99 %
O2 Saturation: 99 %
O2 Saturation: 97 %
PCO2 ART: 50.9 mmHg — AB (ref 32.0–48.0)
PCO2 ART: 40.5 mmHg (ref 32.0–48.0)
PCO2 ART: 28.5 mmHg — AB (ref 32.0–48.0)
PH ART: 7.386 (ref 7.350–7.450)
PH ART: 7.472 — AB (ref 7.350–7.450)
PH ART: 7.464 — AB (ref 7.350–7.450)
PO2 ART: 108 mmHg (ref 83.0–108.0)
Patient temperature: 37.7
TCO2: 32 mmol/L (ref 0–100)
TCO2: 31 mmol/L (ref 0–100)
TCO2: 28 mmol/L (ref 0–100)
TCO2: 25 mmol/L (ref 0–100)
TCO2: 30 mmol/L (ref 0–100)
pCO2 arterial: 42.6 mmHg (ref 32.0–48.0)
pCO2 arterial: 39.8 mmHg (ref 32.0–48.0)
pH, Arterial: 7.4 (ref 7.350–7.450)
pH, Arterial: 7.53 — ABNORMAL HIGH (ref 7.350–7.450)
pO2, Arterial: 488 mmHg — ABNORMAL HIGH (ref 83.0–108.0)
pO2, Arterial: 388 mmHg — ABNORMAL HIGH (ref 83.0–108.0)
pO2, Arterial: 127 mmHg — ABNORMAL HIGH (ref 83.0–108.0)
pO2, Arterial: 89 mmHg (ref 83.0–108.0)

## 2016-08-29 LAB — PLATELET COUNT: Platelets: 138 10*3/uL — ABNORMAL LOW (ref 150–400)

## 2016-08-29 LAB — CREATININE, SERUM
CREATININE: 0.67 mg/dL (ref 0.61–1.24)
GFR calc Af Amer: >60 mL/min (ref >60)

## 2016-08-29 LAB — CBC
HCT: 35.6 % — ABNORMAL LOW (ref 39.0–52.0)
HEMATOCRIT: 30.9 % — AB (ref 39.0–52.0)
Hemoglobin: 11.3 g/dL — ABNORMAL LOW (ref 13.0–17.0)
Hemoglobin: 13 g/dL (ref 13.0–17.0)
MCH: 32.3 pg (ref 26.0–34.0)
MCH: 32.2 pg (ref 26.0–34.0)
MCHC: 36.6 g/dL — ABNORMAL HIGH (ref 30.0–36.0)
MCHC: 36.5 g/dL — AB (ref 30.0–36.0)
MCV: 88.3 fL (ref 78.0–100.0)
MCV: 88.1 fL (ref 78.0–100.0)
PLATELETS: 155 10*3/uL (ref 150–400)
Platelets: 95 10*3/uL — ABNORMAL LOW (ref 150–400)
RBC: 3.5 MIL/uL — ABNORMAL LOW (ref 4.22–5.81)
RBC: 4.04 MIL/uL — ABNORMAL LOW (ref 4.22–5.81)
RDW: 12.1 % (ref 11.5–15.5)
RDW: 12.3 % (ref 11.5–15.5)
WBC: 6.4 10*3/uL (ref 4.0–10.5)
WBC: 12 10*3/uL — ABNORMAL HIGH (ref 4.0–10.5)

## 2016-08-29 LAB — PROTIME-INR
INR: 1.31
Prothrombin Time: 16.3 seconds — ABNORMAL HIGH (ref 11.4–15.2)

## 2016-08-29 LAB — POCT I-STAT 4, (NA,K, GLUC, HGB,HCT)
GLUCOSE: 85 mg/dL (ref 65–99)
HEMATOCRIT: 29 % — AB (ref 39.0–52.0)
Hemoglobin: 9.9 g/dL — ABNORMAL LOW (ref 13.0–17.0)
Potassium: 3.7 mmol/L (ref 3.5–5.1)
Sodium: 141 mmol/L (ref 135–145)

## 2016-08-29 LAB — APTT: APTT: 38 s — AB (ref 24–36)

## 2016-08-29 LAB — HEMOGLOBIN AND HEMATOCRIT, BLOOD
HCT: 27.6 % — ABNORMAL LOW (ref 39.0–52.0)
HEMOGLOBIN: 10 g/dL — AB (ref 13.0–17.0)

## 2016-08-29 LAB — MAGNESIUM: Magnesium: 1.8 mg/dL (ref 1.7–2.4)

## 2016-08-29 SURGERY — REPLACEMENT, AORTIC VALVE, OPEN
Anesthesia: General | Site: Chest

## 2016-08-29 MED ORDER — LACTATED RINGERS IV SOLN
INTRAVENOUS | Status: DC
  Administered 2016-08-29: 13:00:00 via INTRAVENOUS

## 2016-08-29 MED ORDER — CHLORHEXIDINE GLUCONATE 0.12 % MT SOLN
15.0000 mL | OROMUCOSAL | Status: AC
  Administered 2016-08-29: 15 mL via OROMUCOSAL

## 2016-08-29 MED ORDER — SODIUM CHLORIDE 0.9 % IV SOLN
INTRAVENOUS | Status: DC
  Filled 2016-08-29 (×2): qty 2.5

## 2016-08-29 MED ORDER — LIDOCAINE 2% (20 MG/ML) 5 ML SYRINGE
INTRAMUSCULAR | Status: AC
  Filled 2016-08-29: qty 5

## 2016-08-29 MED ORDER — MIDAZOLAM HCL 5 MG/5ML IJ SOLN
INTRAMUSCULAR | Status: DC | PRN
  Administered 2016-08-29: 1 mg via INTRAVENOUS
  Administered 2016-08-29: 1.5 mg via INTRAVENOUS
  Administered 2016-08-29 (×2): 1 mg via INTRAVENOUS
  Administered 2016-08-29: 3 mg via INTRAVENOUS
  Administered 2016-08-29: 2.5 mg via INTRAVENOUS

## 2016-08-29 MED ORDER — METOCLOPRAMIDE HCL 5 MG/ML IJ SOLN
10.0000 mg | Freq: Three times a day (TID) | INTRAMUSCULAR | Status: DC
  Administered 2016-08-29 – 2016-08-30 (×3): 10 mg via INTRAVENOUS
  Filled 2016-08-29 (×3): qty 2

## 2016-08-29 MED ORDER — ORAL CARE MOUTH RINSE
15.0000 mL | Freq: Four times a day (QID) | OROMUCOSAL | Status: DC
  Administered 2016-08-29 – 2016-09-02 (×9): 15 mL via OROMUCOSAL

## 2016-08-29 MED ORDER — FENTANYL CITRATE (PF) 250 MCG/5ML IJ SOLN
INTRAMUSCULAR | Status: DC | PRN
  Administered 2016-08-29 (×2): 100 ug via INTRAVENOUS
  Administered 2016-08-29: 50 ug via INTRAVENOUS
  Administered 2016-08-29: 150 ug via INTRAVENOUS
  Administered 2016-08-29: 50 ug via INTRAVENOUS
  Administered 2016-08-29: 100 ug via INTRAVENOUS
  Administered 2016-08-29: 50 ug via INTRAVENOUS
  Administered 2016-08-29: 100 ug via INTRAVENOUS
  Administered 2016-08-29: 50 ug via INTRAVENOUS
  Administered 2016-08-29 (×4): 100 ug via INTRAVENOUS

## 2016-08-29 MED ORDER — DEXMEDETOMIDINE HCL IN NACL 200 MCG/50ML IV SOLN
0.0000 ug/kg/h | INTRAVENOUS | Status: DC
  Filled 2016-08-29: qty 50

## 2016-08-29 MED ORDER — PHENYLEPHRINE HCL 10 MG/ML IJ SOLN
INTRAVENOUS | Status: DC | PRN
  Administered 2016-08-29: 20 ug/min via INTRAVENOUS

## 2016-08-29 MED ORDER — PROPOFOL 10 MG/ML IV BOLUS
INTRAVENOUS | Status: DC | PRN
  Administered 2016-08-29: 50 mg via INTRAVENOUS
  Administered 2016-08-29: 20 mg via INTRAVENOUS

## 2016-08-29 MED ORDER — PROPOFOL 10 MG/ML IV BOLUS
INTRAVENOUS | Status: AC
  Filled 2016-08-29: qty 20

## 2016-08-29 MED ORDER — LACTATED RINGERS IV SOLN
INTRAVENOUS | Status: DC | PRN
  Administered 2016-08-29: 07:00:00 via INTRAVENOUS

## 2016-08-29 MED ORDER — DEXTROSE 5 % IV SOLN
1.5000 g | Freq: Two times a day (BID) | INTRAVENOUS | Status: AC
  Administered 2016-08-29 – 2016-08-31 (×4): 1.5 g via INTRAVENOUS
  Filled 2016-08-29 (×4): qty 1.5

## 2016-08-29 MED ORDER — HEPARIN SODIUM (PORCINE) 1000 UNIT/ML IJ SOLN
INTRAMUSCULAR | Status: DC | PRN
  Administered 2016-08-29: 22000 [IU] via INTRAVENOUS

## 2016-08-29 MED ORDER — METOPROLOL TARTRATE 25 MG/10 ML ORAL SUSPENSION: 12.5000 mg | Freq: Two times a day (BID) | ORAL | Status: DC

## 2016-08-29 MED ORDER — MIDAZOLAM HCL 10 MG/2ML IJ SOLN
INTRAMUSCULAR | Status: AC
Start: 2016-08-29 — End: 2016-08-29
  Filled 2016-08-29: qty 2

## 2016-08-29 MED ORDER — MIDAZOLAM HCL 2 MG/2ML IJ SOLN
2.0000 mg | INTRAMUSCULAR | Status: DC | PRN
  Administered 2016-08-29: 2 mg via INTRAVENOUS
  Filled 2016-08-29: qty 2

## 2016-08-29 MED ORDER — SODIUM CHLORIDE 0.9 % IV SOLN
INTRAVENOUS | Status: DC | PRN
  Administered 2016-08-29: 12:00:00 via INTRAVENOUS

## 2016-08-29 MED ORDER — BISACODYL 10 MG RE SUPP
10.0000 mg | Freq: Every day | RECTAL | Status: DC
  Filled 2016-08-29: qty 1

## 2016-08-29 MED ORDER — INSULIN REGULAR BOLUS VIA INFUSION
0.0000 [IU] | Freq: Three times a day (TID) | INTRAVENOUS | Status: DC
  Filled 2016-08-29: qty 10

## 2016-08-29 MED ORDER — SODIUM CHLORIDE 0.9% FLUSH: 3.0000 mL | INTRAVENOUS | Status: DC | PRN

## 2016-08-29 MED ORDER — ACETAMINOPHEN 650 MG RE SUPP
650.0000 mg | Freq: Once | RECTAL | Status: AC
  Administered 2016-08-29: 650 mg via RECTAL

## 2016-08-29 MED ORDER — FAMOTIDINE IN NACL 20-0.9 MG/50ML-% IV SOLN
20.0000 mg | Freq: Two times a day (BID) | INTRAVENOUS | Status: AC
  Administered 2016-08-29: 20 mg via INTRAVENOUS

## 2016-08-29 MED ORDER — HEMOSTATIC AGENTS (NO CHARGE) OPTIME
TOPICAL | Status: DC | PRN
  Administered 2016-08-29: 1 via TOPICAL

## 2016-08-29 MED ORDER — SUCCINYLCHOLINE CHLORIDE 200 MG/10ML IV SOSY
PREFILLED_SYRINGE | INTRAVENOUS | Status: AC
  Filled 2016-08-29: qty 10

## 2016-08-29 MED ORDER — NITROGLYCERIN IN D5W 200-5 MCG/ML-% IV SOLN: 0.0000 ug/min | INTRAVENOUS | Status: DC

## 2016-08-29 MED ORDER — ROCURONIUM BROMIDE 10 MG/ML (PF) SYRINGE
PREFILLED_SYRINGE | INTRAVENOUS | Status: DC | PRN
  Administered 2016-08-29 (×3): 50 mg via INTRAVENOUS
  Administered 2016-08-29: 20 mg via INTRAVENOUS
  Administered 2016-08-29: 30 mg via INTRAVENOUS

## 2016-08-29 MED ORDER — HEPARIN SODIUM (PORCINE) 1000 UNIT/ML IJ SOLN
INTRAMUSCULAR | Status: AC
  Filled 2016-08-29: qty 1

## 2016-08-29 MED ORDER — PANTOPRAZOLE SODIUM 40 MG PO TBEC
40.0000 mg | DELAYED_RELEASE_TABLET | Freq: Every day | ORAL | Status: DC
  Administered 2016-08-31 – 2016-09-03 (×4): 40 mg via ORAL
  Filled 2016-08-29 (×4): qty 1

## 2016-08-29 MED ORDER — CHLORHEXIDINE GLUCONATE 4 % EX LIQD: 30.0000 mL | CUTANEOUS | Status: DC

## 2016-08-29 MED ORDER — THROMBIN 20000 UNITS EX SOLR
CUTANEOUS | Status: DC | PRN
  Administered 2016-08-29: 20000 [IU] via TOPICAL

## 2016-08-29 MED ORDER — SODIUM CHLORIDE 0.9% FLUSH
3.0000 mL | Freq: Two times a day (BID) | INTRAVENOUS | Status: DC
  Administered 2016-08-30 – 2016-09-02 (×5): 3 mL via INTRAVENOUS

## 2016-08-29 MED ORDER — PROTAMINE SULFATE 10 MG/ML IV SOLN
INTRAVENOUS | Status: DC | PRN
  Administered 2016-08-29: 20 mg via INTRAVENOUS
  Administered 2016-08-29: 50 mg via INTRAVENOUS
  Administered 2016-08-29: 20 mg via INTRAVENOUS
  Administered 2016-08-29: 10 mg via INTRAVENOUS
  Administered 2016-08-29 (×2): 20 mg via INTRAVENOUS
  Administered 2016-08-29 (×2): 30 mg via INTRAVENOUS

## 2016-08-29 MED ORDER — CHLORHEXIDINE GLUCONATE 0.12% ORAL RINSE (MEDLINE KIT)
15.0000 mL | Freq: Two times a day (BID) | OROMUCOSAL | Status: DC
  Administered 2016-08-29 – 2016-09-02 (×3): 15 mL via OROMUCOSAL

## 2016-08-29 MED ORDER — PHENYLEPHRINE 40 MCG/ML (10ML) SYRINGE FOR IV PUSH (FOR BLOOD PRESSURE SUPPORT)
PREFILLED_SYRINGE | INTRAVENOUS | Status: AC
  Filled 2016-08-29: qty 10

## 2016-08-29 MED ORDER — SODIUM CHLORIDE 0.9 % IV SOLN
INTRAVENOUS | Status: DC
  Administered 2016-08-29: 13:00:00 via INTRAVENOUS

## 2016-08-29 MED ORDER — POTASSIUM CHLORIDE 10 MEQ/50ML IV SOLN
10.0000 meq | Freq: Once | INTRAVENOUS | Status: AC
  Administered 2016-08-29: 10 meq via INTRAVENOUS

## 2016-08-29 MED ORDER — THROMBIN 20000 UNITS EX SOLR
CUTANEOUS | Status: AC
  Filled 2016-08-29: qty 20000

## 2016-08-29 MED ORDER — ROCURONIUM BROMIDE 10 MG/ML (PF) SYRINGE
PREFILLED_SYRINGE | INTRAVENOUS | Status: AC
  Filled 2016-08-29: qty 10

## 2016-08-29 MED ORDER — FENTANYL CITRATE (PF) 250 MCG/5ML IJ SOLN
INTRAMUSCULAR | Status: AC
  Filled 2016-08-29: qty 25

## 2016-08-29 MED ORDER — LACTATED RINGERS IV SOLN
INTRAVENOUS | Status: DC | PRN
  Administered 2016-08-29 (×2): via INTRAVENOUS

## 2016-08-29 MED ORDER — LACTATED RINGERS IV SOLN: 500.0000 mL | Freq: Once | INTRAVENOUS | Status: DC | PRN

## 2016-08-29 MED ORDER — EPHEDRINE 5 MG/ML INJ
INTRAVENOUS | Status: AC
  Filled 2016-08-29: qty 10

## 2016-08-29 MED ORDER — ASPIRIN 81 MG PO CHEW
324.0000 mg | CHEWABLE_TABLET | Freq: Every day | ORAL | Status: DC
  Administered 2016-09-01: 324 mg
  Filled 2016-08-29 (×2): qty 4

## 2016-08-29 MED ORDER — METOPROLOL TARTRATE 12.5 MG HALF TABLET: 12.5000 mg | ORAL_TABLET | Freq: Two times a day (BID) | ORAL | Status: DC

## 2016-08-29 MED ORDER — BISACODYL 5 MG PO TBEC
10.0000 mg | DELAYED_RELEASE_TABLET | Freq: Every day | ORAL | Status: DC
  Administered 2016-08-30 – 2016-09-02 (×2): 10 mg via ORAL
  Filled 2016-08-29 (×3): qty 2

## 2016-08-29 MED ORDER — OXYCODONE HCL 5 MG PO TABS
5.0000 mg | ORAL_TABLET | ORAL | Status: DC | PRN
  Administered 2016-08-29: 5 mg via ORAL
  Filled 2016-08-29 (×2): qty 1

## 2016-08-29 MED ORDER — TRAMADOL HCL 50 MG PO TABS
50.0000 mg | ORAL_TABLET | ORAL | Status: DC | PRN
  Administered 2016-08-29: 100 mg via ORAL
  Administered 2016-08-30 – 2016-09-01 (×8): 50 mg via ORAL
  Filled 2016-08-29: qty 2
  Filled 2016-08-29 (×8): qty 1

## 2016-08-29 MED ORDER — MAGNESIUM SULFATE 4 GM/100ML IV SOLN
4.0000 g | Freq: Once | INTRAVENOUS | Status: AC
  Administered 2016-08-29: 4 g via INTRAVENOUS
  Filled 2016-08-29: qty 100

## 2016-08-29 MED ORDER — POTASSIUM CHLORIDE 10 MEQ/50ML IV SOLN
10.0000 meq | INTRAVENOUS | Status: AC
  Administered 2016-08-29 (×3): 10 meq via INTRAVENOUS

## 2016-08-29 MED ORDER — ALBUMIN HUMAN 5 % IV SOLN
INTRAVENOUS | Status: DC | PRN
Start: 2016-08-29 — End: 2016-08-29
  Administered 2016-08-29 (×2): via INTRAVENOUS

## 2016-08-29 MED ORDER — DOCUSATE SODIUM 100 MG PO CAPS
200.0000 mg | ORAL_CAPSULE | Freq: Every day | ORAL | Status: DC
  Administered 2016-08-30 – 2016-09-03 (×3): 200 mg via ORAL
  Filled 2016-08-29 (×3): qty 2

## 2016-08-29 MED ORDER — MORPHINE SULFATE (PF) 2 MG/ML IV SOLN
2.0000 mg | INTRAVENOUS | Status: DC | PRN
  Administered 2016-08-29 (×2): 2 mg via INTRAVENOUS
  Administered 2016-08-30: 4 mg via INTRAVENOUS
  Filled 2016-08-29 (×3): qty 1
  Filled 2016-08-29: qty 2

## 2016-08-29 MED ORDER — LIDOCAINE 2% (20 MG/ML) 5 ML SYRINGE
INTRAMUSCULAR | Status: DC | PRN
  Administered 2016-08-29: 80 mg via INTRAVENOUS

## 2016-08-29 MED ORDER — ARTIFICIAL TEARS OP OINT
TOPICAL_OINTMENT | OPHTHALMIC | Status: DC | PRN
  Administered 2016-08-29: 1 via OPHTHALMIC

## 2016-08-29 MED ORDER — MORPHINE SULFATE (PF) 2 MG/ML IV SOLN
1.0000 mg | INTRAVENOUS | Status: AC | PRN
  Administered 2016-08-29: 2 mg via INTRAVENOUS
  Administered 2016-08-29: 1 mg via INTRAVENOUS
  Filled 2016-08-29: qty 1

## 2016-08-29 MED ORDER — METOPROLOL TARTRATE 5 MG/5ML IV SOLN: 2.5000 mg | INTRAVENOUS | Status: DC | PRN

## 2016-08-29 MED ORDER — ACETAMINOPHEN 160 MG/5ML PO SOLN: 1000.0000 mg | Freq: Four times a day (QID) | ORAL | Status: DC

## 2016-08-29 MED ORDER — ARTIFICIAL TEARS OP OINT
TOPICAL_OINTMENT | OPHTHALMIC | Status: AC
  Filled 2016-08-29: qty 3.5

## 2016-08-29 MED ORDER — LACTATED RINGERS IV SOLN: INTRAVENOUS | Status: DC

## 2016-08-29 MED ORDER — SODIUM CHLORIDE 0.45 % IV SOLN
INTRAVENOUS | Status: DC | PRN
Start: 2016-08-29 — End: 2016-09-03
  Administered 2016-08-29: 13:00:00 via INTRAVENOUS

## 2016-08-29 MED ORDER — ONDANSETRON HCL 4 MG/2ML IJ SOLN
4.0000 mg | Freq: Four times a day (QID) | INTRAMUSCULAR | Status: DC | PRN
  Administered 2016-08-29 – 2016-08-30 (×2): 4 mg via INTRAVENOUS
  Filled 2016-08-29 (×2): qty 2

## 2016-08-29 MED ORDER — VANCOMYCIN HCL IN DEXTROSE 1-5 GM/200ML-% IV SOLN
1000.0000 mg | Freq: Once | INTRAVENOUS | Status: AC
  Administered 2016-08-29: 1000 mg via INTRAVENOUS
  Filled 2016-08-29: qty 200

## 2016-08-29 MED ORDER — ACETAMINOPHEN 500 MG PO TABS
1000.0000 mg | ORAL_TABLET | Freq: Four times a day (QID) | ORAL | Status: DC
  Administered 2016-08-29 – 2016-09-03 (×19): 1000 mg via ORAL
  Filled 2016-08-29 (×17): qty 2

## 2016-08-29 MED ORDER — ALBUMIN HUMAN 5 % IV SOLN: 250.0000 mL | INTRAVENOUS | Status: AC | PRN

## 2016-08-29 MED ORDER — SODIUM CHLORIDE 0.9 % IV SOLN
250.0000 mL | INTRAVENOUS | Status: DC
  Administered 2016-08-30: 250 mL via INTRAVENOUS

## 2016-08-29 MED ORDER — PROTAMINE SULFATE 10 MG/ML IV SOLN
INTRAVENOUS | Status: AC
  Filled 2016-08-29: qty 25

## 2016-08-29 MED ORDER — ACETAMINOPHEN 160 MG/5ML PO SOLN: 650.0000 mg | Freq: Once | ORAL | Status: AC

## 2016-08-29 MED ORDER — ASPIRIN EC 325 MG PO TBEC
325.0000 mg | DELAYED_RELEASE_TABLET | Freq: Every day | ORAL | Status: DC
  Administered 2016-08-30 – 2016-09-03 (×4): 325 mg via ORAL
  Filled 2016-08-29 (×5): qty 1

## 2016-08-29 MED ORDER — DEXTROSE 5 % IV SOLN
0.0000 ug/min | INTRAVENOUS | Status: DC
  Filled 2016-08-29: qty 2

## 2016-08-29 SURGICAL SUPPLY — 61 items
ADAPTER CARDIO PERF ANTE/RETRO (ADAPTER) ×4 IMPLANT
BAG DECANTER FOR FLEXI CONT (MISCELLANEOUS) ×4 IMPLANT
BLADE STERNUM SYSTEM 6 (BLADE) ×4 IMPLANT
BLADE SURG 15 STRL LF DISP TIS (BLADE) ×4 IMPLANT
BLADE SURG 15 STRL SS (BLADE) ×4
CANISTER SUCTION 2500CC (MISCELLANEOUS) ×4 IMPLANT
CANNULA GUNDRY RCSP 15FR (MISCELLANEOUS) ×4 IMPLANT
CATH ROBINSON RED A/P 18FR (CATHETERS) ×12 IMPLANT
CATH THORACIC 36FR (CATHETERS) ×4 IMPLANT
CATH THORACIC 36FR RT ANG (CATHETERS) ×4 IMPLANT
CONT SPEC STER OR (MISCELLANEOUS) ×4 IMPLANT
COVER SURGICAL LIGHT HANDLE (MISCELLANEOUS) ×4 IMPLANT
CRADLE DONUT ADULT HEAD (MISCELLANEOUS) ×4 IMPLANT
DRAPE SLUSH/WARMER DISC (DRAPES) ×4 IMPLANT
DRSG COVADERM 4X14 (GAUZE/BANDAGES/DRESSINGS) ×4 IMPLANT
ELECT CAUTERY BLADE 6.4 (BLADE) ×4 IMPLANT
ELECT REM PT RETURN 9FT ADLT (ELECTROSURGICAL) ×8
ELECTRODE REM PT RTRN 9FT ADLT (ELECTROSURGICAL) ×4 IMPLANT
FELT TEFLON 1X6 (MISCELLANEOUS) ×8 IMPLANT
GAUZE SPONGE 4X4 12PLY STRL (GAUZE/BANDAGES/DRESSINGS) ×4 IMPLANT
GLOVE BIO SURGEON STRL SZ 6 (GLOVE) IMPLANT
GLOVE BIO SURGEON STRL SZ 6.5 (GLOVE) ×12 IMPLANT
GLOVE BIO SURGEON STRL SZ7 (GLOVE) IMPLANT
GLOVE BIO SURGEON STRL SZ7.5 (GLOVE) ×4 IMPLANT
GLOVE BIO SURGEONS STRL SZ 6.5 (GLOVE) ×4
GLOVE EUDERMIC 7 POWDERFREE (GLOVE) ×8 IMPLANT
GOWN STRL REUS W/ TWL LRG LVL3 (GOWN DISPOSABLE) ×8 IMPLANT
GOWN STRL REUS W/ TWL XL LVL3 (GOWN DISPOSABLE) ×2 IMPLANT
GOWN STRL REUS W/TWL LRG LVL3 (GOWN DISPOSABLE) ×8
GOWN STRL REUS W/TWL XL LVL3 (GOWN DISPOSABLE) ×2
HEART VENT LT CURVED (MISCELLANEOUS) ×4 IMPLANT
HEMOSTAT POWDER SURGIFOAM 1G (HEMOSTASIS) ×12 IMPLANT
HEMOSTAT SURGICEL 2X14 (HEMOSTASIS) ×4 IMPLANT
KIT BASIN OR (CUSTOM PROCEDURE TRAY) ×4 IMPLANT
KIT CATH CPB BARTLE (MISCELLANEOUS) ×4 IMPLANT
KIT ROOM TURNOVER OR (KITS) ×4 IMPLANT
KIT SUCTION CATH 14FR (SUCTIONS) ×4 IMPLANT
LINE VENT (MISCELLANEOUS) ×4 IMPLANT
NS IRRIG 1000ML POUR BTL (IV SOLUTION) ×24 IMPLANT
PACK OPEN HEART (CUSTOM PROCEDURE TRAY) ×4 IMPLANT
PAD ARMBOARD 7.5X6 YLW CONV (MISCELLANEOUS) ×8 IMPLANT
SET CARDIOPLEGIA MPS 5001102 (MISCELLANEOUS) ×4 IMPLANT
SPONGE GAUZE 4X4 12PLY STER LF (GAUZE/BANDAGES/DRESSINGS) ×4 IMPLANT
SUT BONE WAX W31G (SUTURE) ×4 IMPLANT
SUT ETHIBON 2 0 V 52N 30 (SUTURE) ×16 IMPLANT
SUT PROLENE 3 0 SH DA (SUTURE) IMPLANT
SUT PROLENE 3 0 SH1 36 (SUTURE) ×4 IMPLANT
SUT PROLENE 4 0 RB 1 (SUTURE) ×10
SUT PROLENE 4-0 RB1 .5 CRCL 36 (SUTURE) ×10 IMPLANT
SUT SILK 2 0 SH CR/8 (SUTURE) ×4 IMPLANT
SUT STEEL 6MS V (SUTURE) ×8 IMPLANT
SUT VIC AB 1 CTX 36 (SUTURE) ×6
SUT VIC AB 1 CTX36XBRD ANBCTR (SUTURE) ×6 IMPLANT
SUTURE E-PAK OPEN HEART (SUTURE) ×4 IMPLANT
SYSTEM SAHARA CHEST DRAIN ATS (WOUND CARE) ×4 IMPLANT
TOWEL OR 17X24 6PK STRL BLUE (TOWEL DISPOSABLE) ×8 IMPLANT
TOWEL OR 17X26 10 PK STRL BLUE (TOWEL DISPOSABLE) ×8 IMPLANT
TRAY FOLEY IC TEMP SENS 16FR (CATHETERS) ×4 IMPLANT
UNDERPAD 30X30 (UNDERPADS AND DIAPERS) ×4 IMPLANT
VALVE MAGNA EASE AORTIC 23MM (Prosthesis & Implant Heart) ×4 IMPLANT
WATER STERILE IRR 1000ML POUR (IV SOLUTION) ×8 IMPLANT

## 2016-08-29 NOTE — Interval H&P Note (Signed)
History and Physical Interval Note:  08/29/2016 5:50 AM  Ronald Gomez  has presented today for surgery, with the diagnosis of SEVERE AS  The various methods of treatment have been discussed with the patient and family. After consideration of risks, benefits and other options for treatment, the patient has consented to  Procedure(s): AORTIC VALVE REPLACEMENT (AVR) (N/A) TRANSESOPHAGEAL ECHOCARDIOGRAM (TEE) (N/A) as a surgical intervention .  The patient's history has been reviewed, patient examined, no change in status, stable for surgery.  I have reviewed the patient's chart and labs.  Questions were answered to the patient's satisfaction.     Gaye Pollack

## 2016-08-29 NOTE — OR Nursing (Signed)
1st call SICU 1108 2nd call SICU 1145

## 2016-08-29 NOTE — Progress Notes (Signed)
Pt on CPAP, ABG 7.53/28.5/108/23.7.  -34 NIF, 1.24L VC Dr. Cyndia Bent called. Ok to extubate.

## 2016-08-29 NOTE — Anesthesia Procedure Notes (Signed)
Procedures

## 2016-08-29 NOTE — Procedures (Signed)
Extubation Procedure Note  Patient Details:   Name: Ronald Gomez DOB: 1951-04-11 MRN: [id number]   Airway Documentation:     Evaluation  O2 sats: stable throughout Complications: No apparent complications Patient did tolerate procedure well. Bilateral Breath Sounds: Clear   Yes   Positive cuff leak, NIF -34, VC 2.14L prior to extubation.  Post extubation pt placed on nasal cannula 4Lpm with humidity, no stridor noted, pt able to reach 2100 on incentive spirometer.  Mingo Amber Tallie Hevia 08/29/2016, 3:30 PM

## 2016-08-29 NOTE — OR Nursing (Signed)
Foley bag was change before patient was move out of OR 17 due to leaking

## 2016-08-29 NOTE — Transfer of Care (Signed)
Immediate Anesthesia Transfer of Care Note  Patient: Ronald Gomez  Procedure(s) Performed: Procedure(s): AORTIC VALVE REPLACEMENT (AVR) (N/A) TRANSESOPHAGEAL ECHOCARDIOGRAM (TEE) (N/A)  Patient Location: SICU  Anesthesia Type:General  Level of Consciousness: sedated, unresponsive and Patient remains intubated per anesthesia plan  Airway & Oxygen Therapy: Patient remains intubated per anesthesia plan and Patient placed on Ventilator (see vital sign flow sheet for setting)  Post-op Assessment: Report given to RN and Post -op Vital signs reviewed and stable  Post vital signs: Reviewed and stable  Last Vitals:  Vitals:   08/29/16 0609  BP: (!) 168/80  Pulse: 71  Resp: 20  Temp: 36.5 C    Last Pain:  Vitals:   08/29/16 0609  TempSrc: Oral         Complications: No apparent anesthesia complications

## 2016-08-29 NOTE — Anesthesia Procedure Notes (Signed)
Procedure Name: Intubation Date/Time: 08/29/2016 7:45 AM Performed by: Garrison Columbus T Pre-anesthesia Checklist: Patient identified, Emergency Drugs available, Suction available and Patient being monitored Patient Re-evaluated:Patient Re-evaluated prior to inductionOxygen Delivery Method: Circle System Utilized Preoxygenation: Pre-oxygenation with 100% oxygen Intubation Type: IV induction Ventilation: Mask ventilation without difficulty Laryngoscope Size: Miller and 2 Grade View: Grade II Tube type: Oral Tube size: 8.0 mm Number of attempts: 1 Airway Equipment and Method: Stylet Placement Confirmation: ETT inserted through vocal cords under direct vision,  positive ETCO2 and breath sounds checked- equal and bilateral Secured at: 23 cm Tube secured with: Tape Dental Injury: Teeth and Oropharynx as per pre-operative assessment

## 2016-08-29 NOTE — Progress Notes (Signed)
Patient ID: Ronald Gomez, male   DOB: [birth year], 65 y.o.   MRN: [id number]  SICU Evening Rounds:   Hemodynamically stable in sinus rhythm. CI was 2.6 postop but swan removed since the tracing was flat and could not be corrected.  Extubated and neuro intact  Urine output good  CT output low  CBC    Component Value Date/Time   WBC 6.4 08/29/2016 1237   RBC 3.50 (L) 08/29/2016 1237   HGB 11.6 (L) 08/29/2016 1830   HCT 34.0 (L) 08/29/2016 1830   PLT 95 (L) 08/29/2016 1237   MCV 88.3 08/29/2016 1237   MCH 32.3 08/29/2016 1237   MCHC 36.6 (H) 08/29/2016 1237   RDW 12.1 08/29/2016 1237     BMET    Component Value Date/Time   NA 137 08/29/2016 1830   K 4.3 08/29/2016 1830   CL 101 08/29/2016 1830   CO2 23 08/27/2016 1308   GLUCOSE 111 (H) 08/29/2016 1830   BUN 9 08/29/2016 1830   CREATININE 0.80 08/29/2016 1830   CREATININE 1.05 01/31/2016 0749   CALCIUM 9.7 08/27/2016 1308   GFRNONAA >60 08/27/2016 1308   GFRAA >60 08/27/2016 1308     A/P:  Stable postop course. Continue current plans

## 2016-08-29 NOTE — Op Note (Signed)
CARDIOVASCULAR SURGERY OPERATIVE NOTE  08/29/2016 Ronald Gomez [id number]  Surgeon:  Gaye Pollack, MD  First Assistant: Jadene Pierini,  PA-C   Preoperative Diagnosis:  Severe aortic stenosis and moderate AI   Postoperative Diagnosis:  Same   Procedure:  1. Median Sternotomy 2. Extracorporeal circulation 3.   Aortic valve replacement using a 23 mm Edwards Magna-Ease pericardial valve.  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 Review of his echo reports in [place] showed a mean gradient of 14 mm Hg in 01/2011 with aortic valve sclerosis and a mean gradient of 15 mm Hg in 03/2013 with some calcification and thickening of the aortic valve. His first echo here in 11/2014 showed a possible bicuspid valve with moderately thickened and calcified leaflets with a mean gradient of 21 mm Hg and normal LV function. His most recent echo on 12/20/2015 shows an increase in the mean gradient to 35 mm Hg with a dimensionless index of 0.22. The AVA index was 0.37 cm2/m2.The leaflets are severely calcified. LV function is normal with mild LVH and normal LV dimensions. The aorta is normal sized. He had an exercise tolerance test on 12/25/2015 which showed ST depression during stress in II, III, aVF, V5 and V6 leads, beginning at 6 minutes of stress, ending at 10 minutes of stress, and returning to baseline after 5-9 minutes of recovery. He subsequently underwent cardiac cath on 12/25/2015 which showed no significant coronary disease and moderate AS with a mean gradient of 33 mm Hg with a calculated AVA of 1.2 cm2.  He has continued progression of aortic stenosis into the severe range with a rise in the mean gradient to 42-47 mm Hg. Ventricular function is normal. He still has very  good exercise tolerance but is clearly symptomatic compared to last year. I think AVR is indicated at this time.  I discussed the pros and cons of mechanical and tissue valves with him and his wife and he would like to use a pericardial valve which I think is a good choice at his age. I discussed the operative procedure with the patient and his wife including alternatives, benefits and risks; including but not limited to bleeding, blood transfusion, infection, stroke, myocardial infarction, graft failure, heart block requiring a permanent pacemaker, organ dysfunction, and death.  Ronald Gomez understands and agrees to proceed.    Preparation:  The patient was seen in the preoperative holding area and the correct patient, correct operation were confirmed with the patient after reviewing the medical record and catheterization. The consent was signed by me. Preoperative antibiotics were given. A pulmonary arterial line and radial arterial line were placed by the anesthesia team. The patient was taken back to the operating room and positioned supine on the operating room table. After being placed under general endotracheal anesthesia by the anesthesia team a foley catheter was placed. The neck, chest, abdomen, and both legs were prepped with betadine soap and solution and draped in the usual sterile manner. A surgical time-out was taken and the correct patient and operative procedure were confirmed with the nursing and anesthesia staff.   Pre-bypass TEE:   Complete TEE assessment was performed by Dr. Rica Koyanagi. This showed severe AS with bulky calcification of the valve leaflets and moderate AI. LV  function remained normal with mild concentric LVH. There was no PFO.    Post-bypass TEE:   Normal functioning prosthetic aortic valve with no perivalvular leak or regurgitation through the valve. Left ventricular function preserved. No mitral regurgitation.    Cardiopulmonary Bypass:  A median  sternotomy was performed. The pericardium was opened in the midline. Right ventricular function appeared normal. The ascending aorta was of normal size and had no palpable plaque. There were no contraindications to aortic cannulation or cross-clamping. The patient was fully systemically heparinized and the ACT was maintained > 400 sec. The proximal aortic arch was cannulated with a 20 F aortic cannula for arterial inflow. Venous cannulation was performed via the right atrial appendage using a two-staged venous cannula. An antegrade cardioplegia/vent cannula was inserted into the mid-ascending aorta. A left ventricular vent was placed via the right superior pulmonary vein. A retrograde cardioplegia cannnula was placed into the coronary sinus via the right atrium. Aortic occlusion was performed with a single cross-clamp. Systemic cooling to 32 degrees Centigrade and topical cooling of the heart with iced saline were used. Hyperkalemic antegrade cold blood cardioplegia was used to induce diastolic arrest and then cold blood retrograde cardioplegia was given at about 20 minute intervals throughout the period of arrest to maintain myocardial temperature at or below 10 degrees centigrade. A temperature probe was inserted into the interventricular septum and an insulating pad was placed in the pericardium. Carbon dioxide was insufflated into the pericardium at 5L/min throughout the procedure to minimize intracardiac air.   Aortic Valve Replacement:   A transverse aortotomy was performed 1 cm above the take-off of the right coronary artery. The native valve was functionally bicuspid with complete fusion of the left and right cusps with a calcified raphe with markedly calcified leaflets and mild annular calcification. The ostia of the coronary arteries were in normal position and were not obstructed. The native valve leaflets were excised and the annulus was decalcified with rongeurs. Care was taken to remove all  particulate debris. The left ventricle was directly inspected for debris and then irrigated with ice saline solution. The annulus was sized and a size 23 mm  Edwards Magna-Ease pericardial valve was chosen. The model number was [id number] and the serial number was [id number]. While the valve was being prepared 2-0 Ethibond pledgeted horizontal mattress sutures were placed around the annulus with the pledgets in a sub-annular position. The sutures were placed through the sewing ring and the valve lowered into place. The sutures were tied sequentially. The valve seated nicely and the coronary ostia were not obstructed. The prosthetic valve leaflets moved normally and there was no sub-valvular obstruction. The aortotomy was closed using 4-0 Prolene suture in 2 layers with felt strips to reinforce the closure.  Completion:  The patient was rewarmed to 37 degrees Centigrade. De-airing maneuvers were performed and the head placed in trendelenburg position. The crossclamp was removed with a time of 91 minutes. There was spontaneous return of sinus rhythm. The aortotomy was checked for hemostasis. Two temporary epicardial pacing wires were placed on the right atrium and two on the right ventricle. The left ventricular vent and retrograde cardioplegia cannulas were removed. The patient was weaned from CPB without difficulty on no inotropes. CPB time was 113 minutes. Cardiac output was 5 LPM. Heparin was fully reversed with protamine and the aortic and venous cannulas removed. Hemostasis was achieved. Mediastinal drainage tubes were placed. The sternum was closed with  #6 stainless steel wires. The fascia was closed  with continuous # 1 vicryl suture. The subcutaneous tissue was closed with 2-0 vicryl continuous suture. The skin was closed with 3-0 vicryl subcuticular suture. All sponge, needle, and instrument counts were reported correct at the end of the case. Dry sterile dressings were placed over the incisions and around the  chest tubes which were connected to pleurevac suction. The patient was then transported to the surgical intensive care unit in critical but stable condition.

## 2016-08-29 NOTE — Progress Notes (Signed)
  Echocardiogram Echocardiogram Transesophageal has been performed.  Darlina Sicilian M 08/29/2016, 8:51 AM

## 2016-08-29 NOTE — Brief Op Note (Signed)
      [address]       [place] [address]             [phone]     08/29/2016  10:49 AM  PATIENT:  Ronald Gomez  65 y.o. male  PRE-OPERATIVE DIAGNOSIS:  SEVERE AS  POST-OPERATIVE DIAGNOSIS:  SEVERE AS  PROCEDURE:  Procedure(s): AORTIC VALVE REPLACEMENT (AVR) #23 MAGNAEASE TRANSESOPHAGEAL ECHOCARDIOGRAM (TEE)  SURGEON:  Surgeon(s): Gaye Pollack, MD  PHYSICIAN ASSISTANT: WAYNE GOLD PA-C  ANESTHESIA:   general  PATIENT CONDITION:  ICU - intubated and hemodynamically stable.  PRE-OPERATIVE WEIGHT: [id number]  COMPLICATIONS: NO KNOWN

## 2016-08-30 ENCOUNTER — Inpatient Hospital Stay (HOSPITAL_COMMUNITY): Payer: BLUE CROSS/BLUE SHIELD

## 2016-08-30 ENCOUNTER — Encounter (HOSPITAL_COMMUNITY): Payer: Self-pay | Admitting: Surgery

## 2016-08-30 LAB — GLUCOSE, CAPILLARY
GLUCOSE-CAPILLARY: 96 mg/dL (ref 65–99)
GLUCOSE-CAPILLARY: 111 mg/dL — AB (ref 65–99)
GLUCOSE-CAPILLARY: 111 mg/dL — AB (ref 65–99)
GLUCOSE-CAPILLARY: 99 mg/dL (ref 65–99)
GLUCOSE-CAPILLARY: 116 mg/dL — AB (ref 65–99)
GLUCOSE-CAPILLARY: 43 mg/dL — AB (ref 65–99)
GLUCOSE-CAPILLARY: 86 mg/dL (ref 65–99)
GLUCOSE-CAPILLARY: 190 mg/dL — AB (ref 65–99)
GLUCOSE-CAPILLARY: 156 mg/dL — AB (ref 65–99)
GLUCOSE-CAPILLARY: 128 mg/dL — AB (ref 65–99)
Glucose-Capillary: 100 mg/dL — ABNORMAL HIGH (ref 65–99)
Glucose-Capillary: 101 mg/dL — ABNORMAL HIGH (ref 65–99)
Glucose-Capillary: 103 mg/dL — ABNORMAL HIGH (ref 65–99)
Glucose-Capillary: 113 mg/dL — ABNORMAL HIGH (ref 65–99)
Glucose-Capillary: 126 mg/dL — ABNORMAL HIGH (ref 65–99)
Glucose-Capillary: 169 mg/dL — ABNORMAL HIGH (ref 65–99)
Glucose-Capillary: 252 mg/dL — ABNORMAL HIGH (ref 65–99)
Glucose-Capillary: 99 mg/dL (ref 65–99)

## 2016-08-30 LAB — MAGNESIUM
Magnesium: 2.3 mg/dL (ref 1.7–2.4)
Magnesium: 1.9 mg/dL (ref 1.7–2.4)

## 2016-08-30 LAB — CBC
HEMATOCRIT: 32.9 % — AB (ref 39.0–52.0)
HEMATOCRIT: 32.4 % — AB (ref 39.0–52.0)
Hemoglobin: 11.8 g/dL — ABNORMAL LOW (ref 13.0–17.0)
Hemoglobin: 11.5 g/dL — ABNORMAL LOW (ref 13.0–17.0)
MCH: 31.8 pg (ref 26.0–34.0)
MCH: 31.9 pg (ref 26.0–34.0)
MCHC: 35.9 g/dL (ref 30.0–36.0)
MCHC: 35.5 g/dL (ref 30.0–36.0)
MCV: 88.7 fL (ref 78.0–100.0)
MCV: 89.8 fL (ref 78.0–100.0)
Platelets: 124 10*3/uL — ABNORMAL LOW (ref 150–400)
Platelets: 120 10*3/uL — ABNORMAL LOW (ref 150–400)
RBC: 3.71 MIL/uL — ABNORMAL LOW (ref 4.22–5.81)
RBC: 3.61 MIL/uL — ABNORMAL LOW (ref 4.22–5.81)
RDW: 12.4 % (ref 11.5–15.5)
RDW: 12.4 % (ref 11.5–15.5)
WBC: 11.7 10*3/uL — ABNORMAL HIGH (ref 4.0–10.5)
WBC: 13.2 10*3/uL — AB (ref 4.0–10.5)

## 2016-08-30 LAB — BASIC METABOLIC PANEL
Anion gap: 7 (ref 5–15)
BUN: 7 mg/dL (ref 6–20)
CALCIUM: 8.1 mg/dL — AB (ref 8.9–10.3)
CO2: 25 mmol/L (ref 22–32)
CREATININE: 0.85 mg/dL (ref 0.61–1.24)
Chloride: 101 mmol/L (ref 101–111)
GFR calc non Af Amer: >60 mL/min (ref >60)
GLUCOSE: 100 mg/dL — AB (ref 65–99)
Potassium: 4 mmol/L (ref 3.5–5.1)
Sodium: 133 mmol/L — ABNORMAL LOW (ref 135–145)

## 2016-08-30 LAB — CREATININE, SERUM
Creatinine, Ser: 0.94 mg/dL (ref 0.61–1.24)
GFR calc non Af Amer: >60 mL/min (ref >60)

## 2016-08-30 MED ORDER — ALPRAZOLAM 0.5 MG PO TABS
0.5000 mg | ORAL_TABLET | Freq: Once | ORAL | Status: AC
  Administered 2016-08-30: 0.5 mg via ORAL
  Filled 2016-08-30: qty 1

## 2016-08-30 MED ORDER — INSULIN ASPART 100 UNIT/ML ~~LOC~~ SOLN
0.0000 [IU] | SUBCUTANEOUS | Status: DC
  Administered 2016-08-30 – 2016-08-31 (×2): 2 [IU] via SUBCUTANEOUS
  Administered 2016-08-31 – 2016-09-01 (×3): 4 [IU] via SUBCUTANEOUS
  Administered 2016-09-01: 2 [IU] via SUBCUTANEOUS
  Administered 2016-09-01: 4 [IU] via SUBCUTANEOUS
  Administered 2016-09-01: 2 [IU] via SUBCUTANEOUS

## 2016-08-30 MED ORDER — ALPRAZOLAM 0.5 MG PO TABS: 0.5000 mg | ORAL_TABLET | Freq: Once | ORAL | Status: DC

## 2016-08-30 MED ORDER — ZOLPIDEM TARTRATE 5 MG PO TABS
10.0000 mg | ORAL_TABLET | Freq: Every evening | ORAL | Status: DC | PRN
  Administered 2016-08-30 – 2016-09-02 (×4): 10 mg via ORAL
  Filled 2016-08-30 (×4): qty 2

## 2016-08-30 MED ORDER — METOPROLOL TARTRATE 25 MG PO TABS
25.0000 mg | ORAL_TABLET | Freq: Two times a day (BID) | ORAL | Status: DC
  Administered 2016-08-30 – 2016-09-03 (×8): 25 mg via ORAL
  Filled 2016-08-30 (×8): qty 1

## 2016-08-30 MED ORDER — ALPRAZOLAM 0.5 MG PO TABS
0.5000 mg | ORAL_TABLET | Freq: Two times a day (BID) | ORAL | Status: DC | PRN
  Administered 2016-08-30: 0.5 mg via ORAL
  Filled 2016-08-30 (×2): qty 1

## 2016-08-30 MED ORDER — ENOXAPARIN SODIUM 40 MG/0.4ML ~~LOC~~ SOLN
40.0000 mg | Freq: Every day | SUBCUTANEOUS | Status: DC
  Administered 2016-08-30 – 2016-09-02 (×4): 40 mg via SUBCUTANEOUS
  Filled 2016-08-30 (×4): qty 0.4

## 2016-08-30 MED FILL — Potassium Chloride Inj 2 mEq/ML: INTRAVENOUS | Qty: 40 | Status: AC

## 2016-08-30 MED FILL — Heparin Sodium (Porcine) Inj 1000 Unit/ML: INTRAMUSCULAR | Qty: 20 | Status: AC

## 2016-08-30 MED FILL — Lidocaine HCl IV Inj 20 MG/ML: INTRAVENOUS | Qty: 5 | Status: AC

## 2016-08-30 MED FILL — Sodium Chloride IV Soln 0.9%: INTRAVENOUS | Qty: 2000 | Status: AC

## 2016-08-30 MED FILL — Heparin Sodium (Porcine) Inj 1000 Unit/ML: INTRAMUSCULAR | Qty: 2500 | Status: AC

## 2016-08-30 MED FILL — Heparin Sodium (Porcine) Inj 1000 Unit/ML: INTRAMUSCULAR | Qty: 30 | Status: AC

## 2016-08-30 MED FILL — Electrolyte-R (PH 7.4) Solution: INTRAVENOUS | Qty: 4000 | Status: AC

## 2016-08-30 MED FILL — Magnesium Sulfate Inj 50%: INTRAMUSCULAR | Qty: 10 | Status: AC

## 2016-08-30 MED FILL — Sodium Bicarbonate IV Soln 8.4%: INTRAVENOUS | Qty: 50 | Status: AC

## 2016-08-30 MED FILL — Mannitol IV Soln 20%: INTRAVENOUS | Qty: 500 | Status: AC

## 2016-08-30 NOTE — Progress Notes (Signed)
08/30/2016 3:48 PM Nursing note  Dr. Cyndia Bent paged and made aware of pt. SBP consistently 130-140, MAP mid to upper 90's. No immediate intervention needed at this time. Verbal order received to resume pt. Home dose Lopresor 25mg  BID. Orders enacted. Pt. Updated on plan of care. Will continue to closely monitor patient.  Kanita Delage, Arville Lime

## 2016-08-30 NOTE — Progress Notes (Signed)
08/30/2016 1300 Dr. Cyndia Bent on floor and made aware of pt. Request to keep foley one more day. Verbal order received ok to keep foley in today. Orders enacted. Pt. Updated on plan of care. Will continue to closely monitor patient.  Letitia Sabala, Arville Lime

## 2016-08-30 NOTE — Op Note (Signed)
Ronald Gomez, Ronald Gomez              ACCOUNT NO.:  [id number]  MEDICAL RECORD NO.:  [id number]  LOCATION:  O                            FACILITY:  [facility]  PHYSICIAN:  Ala Dach, M.D.DATE OF BIRTH:  03-Jul-1951  DATE OF PROCEDURE:  08/29/2016 DATE OF DISCHARGE:                              OPERATIVE REPORT   Intraoperative transesophageal echocardiographic report.  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 Gilford Raid, who presents today for aortic valve replacement due to significant and severe aortic stenosis.  He is brought to the holding area the morning of surgery where under local anesthesia with sedation, pulmonary artery, and radial arterial lines were placed.  He then transferred to the OR for routine induction of general anesthesia, after which the TEE probe is prepared and passed oropharyngeally into the stomach, then slightly withdrawn for imaging of the cardiac structures.  PRECARDIOPULMONARY BYPASS EXAMINATION:  Left ventricle:  The left ventricular chamber is seen initially in the short axis view.  There is overall mild concentric left ventricular hypertrophy appreciated.  There is good overall left ventricular function noted.  This would be considered excellent with ejection fraction that exceeds 50%.  All segmental wall areas were thickening and moving inward.  Papillary muscles are well outlined.  Both long and short axis views are obtained.  Mitral valve:  On further pullback to the four-chamber view, the mitral valve is seen and interrogated.  The leaflets are thin, compliant, mobile.  Opening during diastasis seems appropriate, and coaptation is appropriate with only trivial regurgitant flow appreciated on color Doppler.  Again, multiple views are obtained.  Left atrium:  Normal left atrial chambers appreciated.  The left atrial appendage is viewed and is clear.  All size and shape of the LA are normal.  The interatrial septum  shows sort of a bileaflet wall, but despite that, we were unable to see any PFO or any further jets.  The interatrial septum appears completely intact.  Aortic valve:  The aortic valve was seen initially in the short axis view.  There is heavy calcium on the leaflets itself, probably a fusion of the left and right noncoronary cusps.  Calcium distorts most of the view of the aortic valve, but it appears to be a trileaflet aortic valve in its origin.  Long and short axis valve views are obtained.  On color Doppler, there is a 2+ moderate jet of aortic insufficiency associated with a 3+ significant jet of aortic stenosis.  Aortic valve peak pressures and aortic valve areas are measured.  The aortic valve area is estimated to be about 0.6-0.7 cm2.  Right ventricle:  There is normal right ventricular chamber.  Overall contractility is excellent.  Overall chamber size is normal.  Tricuspid valve:  Thin, compliant, mobile, and normal functioning tricuspid valve.  Right atrium:  Right atrial seen interrogated.  Pulmonary artery catheter is noted within.  POST CARDIOPULMONARY BYPASS EXAMINATION:  Left ventricle:  Left ventricular chamber seen in the early bypass.  It is low volume and therefore small in diameter.  With time and separation of replacement of volume, it turns, but subsequently becomes a vigorous left ventricular chamber.  Almost all segmental wall areas contracted excellently.  There was some mild septal dyssynergy noted in the early post bypass.  Aortic valve:  Now in  the aortic position is seen in the pericardial tissue valves.  The struts and supporting areas of the surrounding valve are well visualized.  It is well seated, appears to be functioning entirely in appropriate manner.  All leaflets were mobile.  Satisfactory Opening is appreciated during systolic ejection and the valve closes satisfactory during diastole.  No AI is appreciated and  No perivalvular leaks were  noted.  The rest of the cardiac examination was as previously described. Subsequently, the patient is returned to the cardiac intensive care unit in stable condition.          ______________________________ Ala Dach, M.D.     JTM/MEDQ  D:  08/29/2016  T:  08/30/2016  Job:  [id number]

## 2016-08-30 NOTE — Progress Notes (Signed)
08/30/2016 1600 Phlebotomy contacted and made aware of need for lab draw this evening since arterial line and central line have been removed per orders.  Jadence Kinlaw, Arville Lime

## 2016-08-30 NOTE — Progress Notes (Signed)
1 Day Post-Op Procedure(s) (LRB): AORTIC VALVE REPLACEMENT (AVR) (N/A) TRANSESOPHAGEAL ECHOCARDIOGRAM (TEE) (N/A) Subjective:  No complaints but some anxiety overnight better with Xanax.  Objective: Vital signs in last 24 hours: Temp:  [97.2 F (36.2 C)-100 F (37.8 C)] 98 F (36.7 C) (10/13 0400) Pulse Rate:  [56-95] 65 (10/13 0600) Cardiac Rhythm: Normal sinus rhythm (10/13 0600) Resp:  [10-24] 13 (10/13 0600) BP: (90-157)/(63-89) 112/87 (10/13 0600) SpO2:  [94 %-100 %] 99 % (10/13 0600) Arterial Line BP: (72-169)/(53-82) 75/57 (10/13 0600) FiO2 (%):  [40 %-50 %] 40 % (10/12 1443) Weight:  [75.4 kg (166 lb 3.6 oz)] 75.4 kg (166 lb 3.6 oz) (10/13 0500)  Hemodynamic parameters for last 24 hours: PAP: (23-33)/(12-16) 25/13 CO:  [4.6 L/min-4.8 L/min] 4.8 L/min CI:  [2.4 L/min/m2-2.6 L/min/m2] 2.6 L/min/m2  Intake/Output from previous day: 10/12 0701 - 10/13 0700 In: 4499.1 [P.O.:150; I.V.:3303.1; Blood:146; IV Piggyback:900] Out: [id number] [Urine:4350; Emesis/NG output:75; Blood:300; Chest Tube:340] Intake/Output this shift: No intake/output data recorded.  General appearance: alert and cooperative Neurologic: intact Heart: regular rate and rhythm, S1, S2 normal, no murmur, click, rub or gallop Lungs: clear to auscultation bilaterally Extremities: edema minimal Wound: dressing dry  Lab Results:  Recent Labs  08/29/16 1824 08/29/16 1830 08/30/16 0336  WBC 12.0*  --  11.7*  HGB 13.0 11.6* 11.8*  HCT 35.6* 34.0* 32.9*  PLT 155  --  124*   BMET:  Recent Labs  08/27/16 1308  08/29/16 1830 08/30/16 0336  NA 136  < > 137 133*  K 3.8  < > 4.3 4.0  CL 103  < > 101 101  CO2 23  --   --  25  GLUCOSE 112*  < > 111* 100*  BUN 14  < > 9 7  CREATININE 1.00  < > 0.80 0.85  CALCIUM 9.7  --   --  8.1*  < > = values in this interval not displayed.  PT/INR:  Recent Labs  08/29/16 1237  LABPROT 16.3*  INR 1.31   ABG    Component Value Date/Time   PHART 7.464 (H)  08/29/2016 1622   HCO3 28.4 (H) 08/29/2016 1622   TCO2 25 08/29/2016 1830   ACIDBASEDEF 0.9 08/27/2016 1310   O2SAT 97.0 08/29/2016 1622   CBG (last 3)   Recent Labs  08/30/16 0420 08/30/16 0544 08/30/16 0653  GLUCAP 103* 111* 101*   CXR: ok ECG: sinus, no acute changes.  Assessment/Plan: S/P Procedure(s) (LRB): AORTIC VALVE REPLACEMENT (AVR) (N/A) TRANSESOPHAGEAL ECHOCARDIOGRAM (TEE) (N/A)  He is hemodynamically stable in sinus rhythm. HR 60's so will hold off on beta blocker for now. He was on Toprol XL 25 bid preop.   Remove chest tubes, sleeve, arterial line and foley today.  Mobilize  Glucose under good control and preop Hgb A1c was 5.7 so will stop insulin drip. Will follow CBG's briefly but will not need to follow long if glucose under control.   LOS: 1 day    Ronald Gomez 08/30/2016

## 2016-08-30 NOTE — Anesthesia Postprocedure Evaluation (Signed)
Anesthesia Post Note  Patient: Ronald Gomez  Procedure(s) Performed: Procedure(s) (LRB): AORTIC VALVE REPLACEMENT (AVR) (N/A) TRANSESOPHAGEAL ECHOCARDIOGRAM (TEE) (N/A)  Patient location during evaluation: SICU Anesthesia Type: General Level of consciousness: awake and alert Pain management: pain level controlled Vital Signs Assessment: post-procedure vital signs reviewed and stable Respiratory status: spontaneous breathing, nonlabored ventilation and respiratory function stable Cardiovascular status: blood pressure returned to baseline and stable Postop Assessment: no signs of nausea or vomiting Anesthetic complications: no    Last Vitals:  Vitals:   08/30/16 1800 08/30/16 1925  BP: 135/73   Pulse: (!) 101   Resp: (!) 22   Temp:  37.4 C    Last Pain:  Vitals:   08/30/16 1925  TempSrc: Oral  PainSc:                  Corin Formisano A

## 2016-08-31 ENCOUNTER — Inpatient Hospital Stay (HOSPITAL_COMMUNITY): Payer: BLUE CROSS/BLUE SHIELD

## 2016-08-31 LAB — GLUCOSE, CAPILLARY
GLUCOSE-CAPILLARY: 242 mg/dL — AB (ref 65–99)
GLUCOSE-CAPILLARY: 144 mg/dL — AB (ref 65–99)
Glucose-Capillary: 119 mg/dL — ABNORMAL HIGH (ref 65–99)
Glucose-Capillary: 184 mg/dL — ABNORMAL HIGH (ref 65–99)

## 2016-08-31 LAB — BASIC METABOLIC PANEL
ANION GAP: 6 (ref 5–15)
BUN: 6 mg/dL (ref 6–20)
CALCIUM: 8.3 mg/dL — AB (ref 8.9–10.3)
CO2: 30 mmol/L (ref 22–32)
Chloride: 99 mmol/L — ABNORMAL LOW (ref 101–111)
Creatinine, Ser: 0.91 mg/dL (ref 0.61–1.24)
GFR calc Af Amer: >60 mL/min (ref >60)
GLUCOSE: 116 mg/dL — AB (ref 65–99)
Potassium: 4.2 mmol/L (ref 3.5–5.1)
Sodium: 135 mmol/L (ref 135–145)

## 2016-08-31 LAB — CBC
HCT: 32.2 % — ABNORMAL LOW (ref 39.0–52.0)
HEMOGLOBIN: 11.5 g/dL — AB (ref 13.0–17.0)
MCH: 32 pg (ref 26.0–34.0)
MCHC: 35.7 g/dL (ref 30.0–36.0)
MCV: 89.7 fL (ref 78.0–100.0)
Platelets: 103 10*3/uL — ABNORMAL LOW (ref 150–400)
RBC: 3.59 MIL/uL — ABNORMAL LOW (ref 4.22–5.81)
RDW: 12.4 % (ref 11.5–15.5)
WBC: 10.2 10*3/uL (ref 4.0–10.5)

## 2016-08-31 MED ORDER — DILTIAZEM HCL 100 MG IV SOLR
5.0000 mg/h | INTRAVENOUS | Status: DC
  Administered 2016-08-31: 5 mg/h via INTRAVENOUS
  Filled 2016-08-31 (×2): qty 100

## 2016-08-31 MED ORDER — AMIODARONE HCL IN DEXTROSE 360-4.14 MG/200ML-% IV SOLN
INTRAVENOUS | Status: AC
  Filled 2016-08-31: qty 200

## 2016-08-31 MED ORDER — AMIODARONE HCL IN DEXTROSE 360-4.14 MG/200ML-% IV SOLN
30.0000 mg/h | INTRAVENOUS | Status: DC
  Administered 2016-08-31: 30 mg/h via INTRAVENOUS
  Filled 2016-08-31 (×2): qty 200

## 2016-08-31 MED ORDER — AMIODARONE LOAD VIA INFUSION
150.0000 mg | Freq: Once | INTRAVENOUS | Status: AC
  Administered 2016-08-31: 150 mg via INTRAVENOUS

## 2016-08-31 MED ORDER — METOPROLOL TARTRATE 5 MG/5ML IV SOLN
INTRAVENOUS | Status: AC
  Administered 2016-08-31: 5 mg
  Filled 2016-08-31: qty 5

## 2016-08-31 MED ORDER — METOPROLOL TARTRATE 5 MG/5ML IV SOLN: 5.0000 mg | Freq: Once | INTRAVENOUS | Status: AC

## 2016-08-31 MED ORDER — AMIODARONE HCL IN DEXTROSE 360-4.14 MG/200ML-% IV SOLN
60.0000 mg/h | INTRAVENOUS | Status: AC
  Administered 2016-08-31 (×2): 60 mg/h via INTRAVENOUS
  Filled 2016-08-31: qty 200

## 2016-08-31 NOTE — Progress Notes (Signed)
2 Days Post-Op Procedure(s) (LRB): AORTIC VALVE REPLACEMENT (AVR) (N/A) TRANSESOPHAGEAL ECHOCARDIOGRAM (TEE) (N/A) Subjective: *controlled afib this am Objective: Vital signs in last 24 hours: Temp:  [98.1 F (36.7 C)-99.3 F (37.4 C)] 98.1 F (36.7 C) (10/14 0900) Pulse Rate:  [75-101] 91 (10/14 0800) Cardiac Rhythm: Normal sinus rhythm (10/14 0800) Resp:  [11-22] 15 (10/14 0800) BP: (97-151)/(69-80) 151/80 (10/14 0800) SpO2:  [94 %-100 %] 96 % (10/14 0800) Weight:  [163 lb 5.8 oz (74.1 kg)] 163 lb 5.8 oz (74.1 kg) (10/14 0430)  Hemodynamic parameters for last 24 hours:  stable  Intake/Output from previous day: 10/13 0701 - 10/14 0700 In: 287.3 [I.V.:187.3; IV Piggyback:100] Out: 3067 [Urine:3055; [place]; Chest Tube:10] Intake/Output this shift: No intake/output data recorded.  Neuro intact  Lab Results:  Recent Labs  08/30/16 1904 08/31/16 0429  WBC 13.2* 10.2  HGB 11.5* 11.5*  HCT 32.4* 32.2*  PLT 120* 103*   BMET:  Recent Labs  08/30/16 0336 08/30/16 1904 08/31/16 0429  NA 133*  --  135  K 4.0  --  4.2  CL 101  --  99*  CO2 25  --  30  GLUCOSE 100*  --  116*  BUN 7  --  6  CREATININE 0.85 0.94 0.91  CALCIUM 8.1*  --  8.3*    PT/INR:  Recent Labs  08/29/16 1237  LABPROT 16.3*  INR 1.31   ABG    Component Value Date/Time   PHART 7.464 (H) 08/29/2016 1622   HCO3 28.4 (H) 08/29/2016 1622   TCO2 25 08/29/2016 1830   ACIDBASEDEF 0.9 08/27/2016 1310   O2SAT 97.0 08/29/2016 1622   CBG (last 3)   Recent Labs  08/30/16 1921 08/30/16 2327 08/31/16 0953  GLUCAP 169* 128* 242*    Assessment/Plan: S/P Procedure(s) (LRB): AORTIC VALVE REPLACEMENT (AVR) (N/A) TRANSESOPHAGEAL ECHOCARDIOGRAM (TEE) (N/A) amiodarone for afib  keep in ICU   LOS: 2 days    Ronald Gomez 08/31/2016

## 2016-09-01 ENCOUNTER — Inpatient Hospital Stay (HOSPITAL_COMMUNITY): Payer: BLUE CROSS/BLUE SHIELD

## 2016-09-01 LAB — GLUCOSE, CAPILLARY
GLUCOSE-CAPILLARY: 114 mg/dL — AB (ref 65–99)
GLUCOSE-CAPILLARY: 115 mg/dL — AB (ref 65–99)
GLUCOSE-CAPILLARY: 139 mg/dL — AB (ref 65–99)
Glucose-Capillary: 112 mg/dL — ABNORMAL HIGH (ref 65–99)
Glucose-Capillary: 177 mg/dL — ABNORMAL HIGH (ref 65–99)
Glucose-Capillary: 186 mg/dL — ABNORMAL HIGH (ref 65–99)

## 2016-09-01 LAB — CBC
HCT: 30.9 % — ABNORMAL LOW (ref 39.0–52.0)
Hemoglobin: 10.9 g/dL — ABNORMAL LOW (ref 13.0–17.0)
MCH: 31.7 pg (ref 26.0–34.0)
MCHC: 35.3 g/dL (ref 30.0–36.0)
MCV: 89.8 fL (ref 78.0–100.0)
Platelets: 112 10*3/uL — ABNORMAL LOW (ref 150–400)
RBC: 3.44 MIL/uL — ABNORMAL LOW (ref 4.22–5.81)
RDW: 12.4 % (ref 11.5–15.5)
WBC: 8.2 10*3/uL (ref 4.0–10.5)

## 2016-09-01 LAB — COMPREHENSIVE METABOLIC PANEL
ALT: 21 U/L (ref 17–63)
AST: 18 U/L (ref 15–41)
Albumin: 3.1 g/dL — ABNORMAL LOW (ref 3.5–5.0)
Alkaline Phosphatase: 38 U/L (ref 38–126)
Anion gap: 7 (ref 5–15)
BUN: 7 mg/dL (ref 6–20)
CO2: 28 mmol/L (ref 22–32)
Calcium: 8.2 mg/dL — ABNORMAL LOW (ref 8.9–10.3)
Chloride: 98 mmol/L — ABNORMAL LOW (ref 101–111)
Creatinine, Ser: 0.81 mg/dL (ref 0.61–1.24)
GFR calc Af Amer: >60 mL/min (ref >60)
GFR calc non Af Amer: >60 mL/min (ref >60)
Glucose, Bld: 128 mg/dL — ABNORMAL HIGH (ref 65–99)
Potassium: 4.2 mmol/L (ref 3.5–5.1)
Sodium: 133 mmol/L — ABNORMAL LOW (ref 135–145)
Total Bilirubin: 0.5 mg/dL (ref 0.3–1.2)
Total Protein: 5.2 g/dL — ABNORMAL LOW (ref 6.5–8.1)

## 2016-09-01 MED ORDER — AMIODARONE HCL 200 MG PO TABS
400.0000 mg | ORAL_TABLET | Freq: Two times a day (BID) | ORAL | Status: DC
  Administered 2016-09-01 – 2016-09-03 (×5): 400 mg via ORAL
  Filled 2016-09-01 (×5): qty 2

## 2016-09-01 NOTE — Progress Notes (Signed)
3 Days Post-Op Procedure(s) (LRB): AORTIC VALVE REPLACEMENT (AVR) (N/A) TRANSESOPHAGEAL ECHOCARDIOGRAM (TEE) (N/A) Subjective: Maintaining sinus rhythm on oral amiodarone Ambulating in hallway  Objective: Vital signs in last 24 hours: Temp:  [98.5 F (36.9 C)-99.3 F (37.4 C)] (P) 99 F (37.2 C) (10/15 1700) Pulse Rate:  [65-86] 82 (10/15 1800) Cardiac Rhythm: Normal sinus rhythm (10/15 0800) Resp:  [11-28] 19 (10/15 1800) BP: (96-143)/(59-82) 143/75 (10/15 1800) SpO2:  [93 %-100 %] 96 % (10/15 1800)  Hemodynamic parameters for last 24 hours:    Intake/Output from previous day: 10/14 0701 - 10/15 0700 In: 54.6 [I.V.:54.6] Out: 3201 [Urine:3200; Stool:1] Intake/Output this shift: No intake/output data recorded.       Exam    General- alert and comfortable   Lungs- clear without rales, wheezes   Cor- regular rate and rhythm, no murmur , gallop   Abdomen- soft, non-tender   Extremities - warm, non-tender, minimal edema   Neuro- oriented, appropriate, no focal weakness   Lab Results:  Recent Labs  08/31/16 0429 09/01/16 0328  WBC 10.2 8.2  HGB 11.5* 10.9*  HCT 32.2* 30.9*  PLT 103* 112*   BMET:  Recent Labs  08/31/16 0429 09/01/16 0328  NA 135 133*  K 4.2 4.2  CL 99* 98*  CO2 30 28  GLUCOSE 116* 128*  BUN 6 7  CREATININE 0.91 0.81  CALCIUM 8.3* 8.2*    PT/INR: No results for input(s): LABPROT, INR in the last 72 hours. ABG    Component Value Date/Time   PHART 7.464 (H) 08/29/2016 1622   HCO3 28.4 (H) 08/29/2016 1622   TCO2 25 08/29/2016 1830   ACIDBASEDEF 0.9 08/27/2016 1310   O2SAT 97.0 08/29/2016 1622   CBG (last 3)   Recent Labs  09/01/16 0835 09/01/16 1228 09/01/16 1706  GLUCAP 112* 139* 177*    Assessment/Plan: S/P Procedure(s) (LRB): AORTIC VALVE REPLACEMENT (AVR) (N/A) TRANSESOPHAGEAL ECHOCARDIOGRAM (TEE) (N/A) Continue current care Ready for transfer to floor tomorrow   LOS: 3 days    Ronald Aquas Trigt  Gomez 09/01/2016

## 2016-09-02 LAB — BASIC METABOLIC PANEL
Anion gap: 7 (ref 5–15)
BUN: 12 mg/dL (ref 6–20)
CO2: 28 mmol/L (ref 22–32)
Calcium: 8.4 mg/dL — ABNORMAL LOW (ref 8.9–10.3)
Chloride: 100 mmol/L — ABNORMAL LOW (ref 101–111)
Creatinine, Ser: 0.83 mg/dL (ref 0.61–1.24)
GFR calc Af Amer: >60 mL/min (ref >60)
GFR calc non Af Amer: >60 mL/min (ref >60)
Glucose, Bld: 121 mg/dL — ABNORMAL HIGH (ref 65–99)
Potassium: 4 mmol/L (ref 3.5–5.1)
Sodium: 135 mmol/L (ref 135–145)

## 2016-09-02 LAB — GLUCOSE, CAPILLARY
GLUCOSE-CAPILLARY: 126 mg/dL — AB (ref 65–99)
Glucose-Capillary: 118 mg/dL — ABNORMAL HIGH (ref 65–99)

## 2016-09-02 LAB — CBC
HCT: 29.5 % — ABNORMAL LOW (ref 39.0–52.0)
Hemoglobin: 10.4 g/dL — ABNORMAL LOW (ref 13.0–17.0)
MCH: 31.9 pg (ref 26.0–34.0)
MCHC: 35.3 g/dL (ref 30.0–36.0)
MCV: 90.5 fL (ref 78.0–100.0)
Platelets: 157 10*3/uL (ref 150–400)
RBC: 3.26 MIL/uL — ABNORMAL LOW (ref 4.22–5.81)
RDW: 12.3 % (ref 11.5–15.5)
WBC: 6.9 10*3/uL (ref 4.0–10.5)

## 2016-09-02 MED ORDER — MOVING RIGHT ALONG BOOK
Freq: Once | Status: AC
  Administered 2016-09-02: 08:00:00
  Filled 2016-09-02: qty 1

## 2016-09-02 MED ORDER — SODIUM CHLORIDE 0.9% FLUSH: 3.0000 mL | INTRAVENOUS | Status: DC | PRN

## 2016-09-02 MED ORDER — SODIUM CHLORIDE 0.9% FLUSH
3.0000 mL | Freq: Two times a day (BID) | INTRAVENOUS | Status: DC
  Administered 2016-09-02 (×2): 3 mL via INTRAVENOUS

## 2016-09-02 MED ORDER — SODIUM CHLORIDE 0.9 % IV SOLN: 250.0000 mL | INTRAVENOUS | Status: DC | PRN

## 2016-09-02 NOTE — Progress Notes (Signed)
4 Days Post-Op Procedure(s) (LRB): AORTIC VALVE REPLACEMENT (AVR) (N/A) TRANSESOPHAGEAL ECHOCARDIOGRAM (TEE) (N/A) Subjective: nsr Feels well  Objective: Vital signs in last 24 hours: Temp:  [98.8 F (37.1 C)-99 F (37.2 C)] 99 F (37.2 C) (10/15 2330) Pulse Rate:  [65-86] 80 (10/16 0700) Cardiac Rhythm: Normal sinus rhythm (10/16 0600) Resp:  [13-28] 21 (10/16 0700) BP: (104-143)/(65-82) 125/81 (10/16 0700) SpO2:  [94 %-100 %] 95 % (10/16 0700)  Hemodynamic parameters for last 24 hours:    Intake/Output from previous day: 10/15 0701 - 10/16 0700 In: 1080 [P.O.:1080] Out: 2975 [Urine:2975] Intake/Output this shift: No intake/output data recorded.       Exam    General- alert and comfortable   Lungs- clear without rales, wheezes   Cor- regular rate and rhythm, no murmur , gallop   Abdomen- soft, non-tender   Extremities - warm, non-tender, minimal edema   Neuro- oriented, appropriate, no focal weakness   Lab Results:  Recent Labs  09/01/16 0328 09/02/16 0201  WBC 8.2 6.9  HGB 10.9* 10.4*  HCT 30.9* 29.5*  PLT 112* 157   BMET:  Recent Labs  09/01/16 0328 09/02/16 0201  NA 133* 135  K 4.2 4.0  CL 98* 100*  CO2 28 28  GLUCOSE 128* 121*  BUN 7 12  CREATININE 0.81 0.83  CALCIUM 8.2* 8.4*    PT/INR: No results for input(s): LABPROT, INR in the last 72 hours. ABG    Component Value Date/Time   PHART 7.464 (H) 08/29/2016 1622   HCO3 28.4 (H) 08/29/2016 1622   TCO2 25 08/29/2016 1830   ACIDBASEDEF 0.9 08/27/2016 1310   O2SAT 97.0 08/29/2016 1622   CBG (last 3)   Recent Labs  09/01/16 1706 09/01/16 2007 09/01/16 2328  GLUCAP 177* 186* 115*    Assessment/Plan: S/P Procedure(s) (LRB): AORTIC VALVE REPLACEMENT (AVR) (N/A) TRANSESOPHAGEAL ECHOCARDIOGRAM (TEE) (N/A) Mobilize Plan for transfer to step-down: see transfer orders no coumadin planned   LOS: 4 days    Ronald Gomez 09/02/2016

## 2016-09-02 NOTE — Progress Notes (Signed)
CARDIAC REHAB PHASE I   PRE:  Rate/Rhythm: 76 SR  BP:  Supine:   Sitting: 130/80  Standing:    SaO2: 96%RA  MODE:  Ambulation: 440 ft   POST:  Rate/Rhythm: 79 SR  BP:  Supine:   Sitting: 150/80  Standing:    SaO2: 98%RA 1510-1525 Pt walked 440 ft on RA with hand held asst. Gait steady and tolerated well.   Graylon Good, RN BSN  09/02/2016 3:23 PM

## 2016-09-02 NOTE — Care Management Note (Signed)
Case Management Note  Patient Details  Name: IRELAND VANSOMEREN MRN: [id number] Date of Birth: 09-22-1951  Subjective/Objective:   Pt lives with spouse who will be available 24/7 when he is medically stable for discharge, also states other family members and friends are available as needed.                         Expected Discharge Plan:  Home/Self Care  Discharge planning Services  CM Consult  Status of Service:  In process, will continue to follow  Girard Cooter, RN 09/02/2016, 10:31 AM

## 2016-09-03 ENCOUNTER — Inpatient Hospital Stay (HOSPITAL_COMMUNITY): Payer: BLUE CROSS/BLUE SHIELD

## 2016-09-03 LAB — GLUCOSE, CAPILLARY: Glucose-Capillary: 117 mg/dL — ABNORMAL HIGH (ref 65–99)

## 2016-09-03 LAB — CBC
HCT: 30.4 % — ABNORMAL LOW (ref 39.0–52.0)
Hemoglobin: 10.8 g/dL — ABNORMAL LOW (ref 13.0–17.0)
MCH: 31.9 pg (ref 26.0–34.0)
MCHC: 35.5 g/dL (ref 30.0–36.0)
MCV: 89.7 fL (ref 78.0–100.0)
Platelets: 193 10*3/uL (ref 150–400)
RBC: 3.39 MIL/uL — ABNORMAL LOW (ref 4.22–5.81)
RDW: 12.3 % (ref 11.5–15.5)
WBC: 5.8 10*3/uL (ref 4.0–10.5)

## 2016-09-03 LAB — BASIC METABOLIC PANEL
Anion gap: 9 (ref 5–15)
BUN: 15 mg/dL (ref 6–20)
CO2: 27 mmol/L (ref 22–32)
Calcium: 8.6 mg/dL — ABNORMAL LOW (ref 8.9–10.3)
Chloride: 100 mmol/L — ABNORMAL LOW (ref 101–111)
Creatinine, Ser: 1.1 mg/dL (ref 0.61–1.24)
GFR calc Af Amer: >60 mL/min (ref >60)
GFR calc non Af Amer: >60 mL/min (ref >60)
Glucose, Bld: 116 mg/dL — ABNORMAL HIGH (ref 65–99)
Potassium: 3.9 mmol/L (ref 3.5–5.1)
Sodium: 136 mmol/L (ref 135–145)

## 2016-09-03 MED ORDER — AMIODARONE HCL 400 MG PO TABS: 400.0000 mg | ORAL_TABLET | Freq: Two times a day (BID) | ORAL | 1 refills | Status: DC

## 2016-09-03 MED ORDER — ASPIRIN 325 MG PO TBEC: 325.0000 mg | DELAYED_RELEASE_TABLET | Freq: Every day | ORAL | Status: DC

## 2016-09-03 MED ORDER — OXYCODONE HCL 5 MG PO TABS: 5.0000 mg | ORAL_TABLET | Freq: Four times a day (QID) | ORAL | 0 refills | Status: DC | PRN

## 2016-09-03 MED ORDER — LOSARTAN POTASSIUM 25 MG PO TABS
25.0000 mg | ORAL_TABLET | Freq: Every day | ORAL | Status: DC
  Administered 2016-09-03: 25 mg via ORAL
  Filled 2016-09-03 (×2): qty 1

## 2016-09-03 NOTE — Progress Notes (Signed)
[id number] Education completed with pt and wife who voiced understanding. Encouraged IS. Discussed CRP 2 and will refer to [facility]. Put on discharge video for them to view. Graylon Good RN BSN 09/03/2016 1:39 PM

## 2016-09-03 NOTE — Progress Notes (Addendum)
[address]       [employer] [address]             [phone]      5 Days Post-Op Procedure(s) (LRB): AORTIC VALVE REPLACEMENT (AVR) (N/A) TRANSESOPHAGEAL ECHOCARDIOGRAM (TEE) (N/A) Subjective:  looks and feels well  Objective: Vital signs in last 24 hours: Temp:  [98.2 F (36.8 C)-98.8 F (37.1 C)] 98.4 F (36.9 C) (10/17 0620) Pulse Rate:  [75-91] 81 (10/17 0620) Cardiac Rhythm: Normal sinus rhythm (10/16 1900) Resp:  [13-19] 18 (10/17 0620) BP: (128-156)/(78-96) 141/96 (10/17 0620) SpO2:  [95 %-100 %] 98 % (10/17 0620) Weight:  [157 lb (71.2 kg)] 157 lb (71.2 kg) (10/17 0620)  Hemodynamic parameters for last 24 hours:    Intake/Output from previous day: 10/16 0701 - 10/17 0700 In: 240 [P.O.:240] Out: 1646 [Urine:1200; Stool:1; Chest Tube:445] Intake/Output this shift: No intake/output data recorded.  General appearance: alert, cooperative and no distress Heart: regular rate and rhythm and 2/6 syst murmur Lungs: clear to auscultation bilaterally Abdomen: benign Extremities: no edema Wound: incis healing well  Lab Results:  Recent Labs  09/02/16 0201 09/03/16 0239  WBC 6.9 5.8  HGB 10.4* 10.8*  HCT 29.5* 30.4*  PLT 157 193   BMET:  Recent Labs  09/02/16 0201 09/03/16 0239  NA 135 136  K 4.0 3.9  CL 100* 100*  CO2 28 27  GLUCOSE 121* 116*  BUN 12 15  CREATININE 0.83 1.10  CALCIUM 8.4* 8.6*    PT/INR: No results for input(s): LABPROT, INR in the last 72 hours. ABG    Component Value Date/Time   PHART 7.464 (H) 08/29/2016 1622   HCO3 28.4 (H) 08/29/2016 1622   TCO2 25 08/29/2016 1830   ACIDBASEDEF 0.9 08/27/2016 1310   O2SAT 97.0 08/29/2016 1622   CBG (last 3)   Recent Labs  09/02/16 0805 09/02/16 1626 09/03/16 0625  GLUCAP 118* 126* 117*    Meds Scheduled Meds: . acetaminophen  1,000 mg Oral Q6H   Or  . acetaminophen (TYLENOL) oral liquid 160 mg/5 mL  1,000 mg Per Tube Q6H  . amiodarone  400 mg Oral  BID  . aspirin EC  325 mg Oral Daily   Or  . aspirin  324 mg Per Tube Daily  . bisacodyl  10 mg Oral Daily   Or  . bisacodyl  10 mg Rectal Daily  . docusate sodium  200 mg Oral Daily  . enoxaparin (LOVENOX) injection  40 mg Subcutaneous QHS  . mouth rinse  15 mL Mouth Rinse QID  . metoprolol tartrate  25 mg Oral BID  . pantoprazole  40 mg Oral Daily  . sodium chloride flush  3 mL Intravenous Q12H  . sodium chloride flush  3 mL Intravenous Q12H   Continuous Infusions: . sodium chloride Stopped (08/29/16 1529)  . sodium chloride 250 mL (08/30/16 1954)  . sodium chloride Stopped (08/30/16 1300)   PRN Meds:.sodium chloride, sodium chloride, ALPRAZolam, morphine injection, ondansetron (ZOFRAN) IV, oxyCODONE, sodium chloride flush, sodium chloride flush, traMADol, zolpidem  Xrays No results found.  Assessment/Plan: S/P Procedure(s) (LRB): AORTIC VALVE REPLACEMENT (AVR) (N/A) TRANSESOPHAGEAL ECHOCARDIOGRAM (TEE) (N/A)  1 doing well 2 HTN- restart ARB 3 rhythm stable 4 appears euvolemic 5 d/c EPW's 6 poss home later today v tomorrow   LOS: 5 days      GOLD,WAYNE E 09/03/2016  No further afib Wires out this am, wants to go home this afternoon I have  seen and examined Tawni Pummel and agree with the above assessment  and plan.  Grace Isaac MD Bee[facility] [phone] Office [phone] 09/03/2016 8:49 AM

## 2016-09-03 NOTE — Progress Notes (Signed)
Pacing wires pulled. No issues. Bed rest for an hour. Vitals stable.  Ronald Gomez, Mervin Kung RN

## 2016-09-03 NOTE — Discharge Summary (Signed)
Physician Discharge Summary  Patient ID: Ronald Gomez MRN: [id number] DOB/AGE: 04/11/1951 65 y.o.  Admit date: 08/29/2016 Discharge date: 09/03/2016  Admission Diagnoses:Severe aortic stenosis  Discharge Diagnoses:  Active Problems:   S/P AVR  Patient Active Problem List   Diagnosis Date Noted  . S/P AVR 08/29/2016  . Abnormal stress test 12/25/2015  . Aortic stenosis 12/15/2014  . Essential hypertension 12/15/2014  . 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 Review of his echo reports in [place] showed a mean gradient of 14 mm Hg in 01/2011 with aortic valve sclerosis and a mean gradient of 15 mm Hg in 03/2013 with some calcification and thickening of the aortic valve. His first echo here in 11/2014 showed a possible bicuspid valve with moderately thickened and calcified leaflets with a mean gradient of 21 mm Hg and normal LV function. His most recent echo on 12/20/2015 shows an increase in the mean gradient to 35 mm Hg with a dimensionless index of 0.22. The AVA index was 0.37 cm2/m2.The leaflets are severely calcified. LV function is normal with mild LVH and normal LV dimensions. The aorta is normal sized. He had an exercise tolerance test on 12/25/2015 which showed ST depression during stress in II, III, aVF, V5 and V6 leads, beginning at 6 minutes of stress, ending at 10 minutes of stress, and returning to baseline after 5-9 minutes of recovery. He subsequently underwent cardiac cath on 12/25/2015 which showed no significant coronary disease and moderate AS with a mean gradient of 33 mm Hg with a calculated AVA of 1.2 cm2.  He remains very active exercising vigorously several days per week. He has not been limited in what he can do.   Discharged Condition: good  Hospital  Course: The patient was admitted electively and taken to the operating room on 08/29/2016 where he underwent the below described procedure, aortic valve replacement. He tolerated the procedure quite well was taken to the surgical intensive care unit in stable condition  Postoperative hospital course:  Overall the patient has progressed quite nicely. He has maintained stable hemodynamics although he did have postoperative atrial fibrillation. This is subsequently been chemically cardioverted to sinus rhythm. All routine lines, monitors and drainage devices have been discontinued in standard fashion. He does have an expected acute blood loss anemia but her values are stable. Incisions are noted to be healing well without evidence of infection. He is not felt to require Coumadin. Blood sugars have been under adequate control. He is tolerating gradually increasing activities using standard cardiac rehabilitation protocols. Currently his status is felt to be quite stable for discharge on today's date.  Consults: None  Significant Diagnostic Studies: routine CXR/routine labs  Treatments: surgery:  08/29/2016 Ronald Gomez [id number]  Surgeon:  Gaye Pollack, MD  First Assistant: Jadene Pierini,  PA-C   Preoperative Diagnosis:  Severe aortic stenosis and moderate AI   Postoperative Diagnosis:  Same   Procedure:  1. Median Sternotomy 2. Extracorporeal circulation 3.   Aortic valve replacement using a 23 mm Edwards Magna-Ease pericardial valve.  Anesthesia:  General Endotracheal   Discharge Exam:  General appearance: alert, cooperative and no distress Heart: regular rate and rhythm and 2/6 syst murmur Lungs: clear to auscultation bilaterally Abdomen: benign Extremities: no edema Wound: incis healing  well  Disposition: 01-Home or Self Care     Medication List    STOP taking these medications   amLODipine 10 MG tablet Commonly known as:  NORVASC   aspirin 81 MG  tablet Replaced by:  aspirin 325 MG EC tablet   busPIRone 10 MG tablet Commonly known as:  BUSPAR     TAKE these medications   amiodarone 400 MG tablet Commonly known as:  PACERONE Take 1 tablet (400 mg total) by mouth 2 (two) times daily. ( for 5 days) then take 400 mg daily   aspirin 325 MG EC tablet Take 1 tablet (325 mg total) by mouth daily. Replaces:  aspirin 81 MG tablet   atorvastatin 10 MG tablet Commonly known as:  LIPITOR Take 1 tablet (10 mg total) by mouth daily.   Fish Oil 1000 MG Caps Take 1,000 mg by mouth daily. Stopping prior to procedure   losartan 25 MG tablet Commonly known as:  COZAAR Take 1 tablet (25 mg total) by mouth daily.   metoprolol succinate 25 MG 24 hr tablet Commonly known as:  TOPROL-XL Take 1 tablet (25 mg total) by mouth 2 (two) times daily. What changed:  how much to take  when to take this   oxyCODONE 5 MG immediate release tablet Commonly known as:  Oxy IR/ROXICODONE Take 1-2 tablets (5-10 mg total) by mouth every 6 (six) hours as needed for severe pain.   vitamin C 500 MG tablet Commonly known as:  ASCORBIC ACID Take 500 mg by mouth daily. Stopping prior to procedure   zolpidem 10 MG tablet Commonly known as:  AMBIEN TAKE 1 TABLET EVERY DAY AS DIRECTED      Follow-up [place], MD .   Specialty:  Cardiothoracic Surgery Why:  Appointment to see the surgeon on 10/02/2016 at 10:30 AM. Please obtain a chest x-ray at [facility] at [facility] is located in the same office complex. Contact information: [address] [place] [address] [phone]        [facility] .   Specialty:  Cardiothoracic Surgery Why:  Appointment on 09/09/2016 at 10:30 AM with the nurse for suture removal Contact information: [place], [place]       Larae Grooms, MD .    Specialties:  Cardiology, Radiology, Interventional Cardiology Why:  2 week cardiology appt- office will contact Contact information: [id number] [address] [place] [address] [phone]           Signed: John Giovanni 09/03/2016, 9:34 AM

## 2016-09-03 NOTE — Progress Notes (Signed)
CARDIAC REHAB PHASE I   PRE:  Rate/Rhythm: 85 SR  BP:  Supine:   Sitting: 138/80  Standing:    SaO2: 97%RA  MODE:  Ambulation: 850 ft   POST:  Rate/Rhythm: 92 SR  BP:  Supine:   Sitting: 148/80  Standing:    SaO2: 98%RA 0825-0854 Pt walked 850 ft on RA with hand held asst. Tolerated well. No SOB. Will educate when wife here later.   Graylon Good, RN BSN  09/03/2016 9:17 AM

## 2016-09-03 NOTE — Discharge Instructions (Signed)
Aortic Valve Replacement, Care After °Refer to this sheet in the next few weeks. These instructions provide you with information on caring for yourself after your procedure. Your health care provider may also give you specific instructions. Your treatment has been planned according to current medical practices, but problems sometimes occur. Call your health care provider if you have any problems or questions after your procedure. °HOME CARE INSTRUCTIONS  °· Take medicines only as directed by your health care provider. °· If your health care provider has prescribed elastic stockings, wear them as directed. °· Take frequent naps or rest often throughout the day. °· Avoid lifting over 10 lbs (4.5 kg) or pushing or pulling things with your arms for 6-8 weeks or as directed by your health care provider. °· Avoid driving or airplane travel for 4-6 weeks after surgery or as directed by your health care provider. If you are riding in a car for an extended period, stop every 1-2 hours to stretch your legs. Keep a record of your medicines and medical history with you when traveling. °· Do not drive or operate heavy machinery while taking pain medicine. (narcotics). °· Do not cross your legs. °· Do not use any tobacco products including cigarettes, chewing tobacco, or electronic cigarettes. If you need help quitting, ask your health care provider. °· Do not take baths, swim, or use a hot tub until your health care provider approves. Take showers once your health care provider approves. Pat incisions dry. Do not rub incisions with a washcloth or towel. °· Avoid climbing stairs and using the handrail to pull yourself up for the first 2-3 weeks after surgery. °· Return to work as directed by your health care provider. °· Drink enough fluid to keep your urine clear or pale yellow. °· Do not strain to have a bowel movement. Eat high-fiber foods if you become constipated. You may also take a medicine to help you have a bowel  movement (laxative) as directed by your health care provider. °· Resume sexual activity as directed by your health care provider. Men should not use medicines for erectile dysfunction until their doctor says it is okay. °· If you had a certain type of heart condition in the past, you may need to take antibiotic medicine before having dental work or surgery. Let your dentist and health care providers know if you had one or more of the following: °¨ Previous endocarditis. °¨ An artificial (prosthetic) heart valve. °¨ Congenital heart disease. °SEEK MEDICAL CARE IF: °· You develop a skin rash.   °· You experience sudden changes in your weight. °· You have a fever. °SEEK IMMEDIATE MEDICAL CARE IF:  °· You develop chest pain that is not coming from your incision. °· You have drainage (pus), redness, swelling, or pain at your incision site.   °· You develop shortness of breath or have difficulty breathing.   °· You have increased bleeding from your incision site.   °· You develop light-headedness.   °MAKE SURE YOU:  °· Understand these directions. °· Will watch your condition. °· Will get help right away if you are not doing well or get worse. °  °This information is not intended to replace advice given to you by your health care provider. Make sure you discuss any questions you have with your health care provider. °  °Document Released: 05/23/2005 Document Revised: 11/25/2014 Document Reviewed: 08/18/2012 °Elsevier Interactive Patient Education ©2016 [employer] ° °

## 2016-09-03 NOTE — Progress Notes (Signed)
Removed IV. Reviewed discharge instructions. Awaiting discharge via wheelchair.  [place]aver[place] Foot, Mervin Kung RN

## 2016-09-03 NOTE — Care Management Note (Signed)
Case Management Note Previous CM note initiated by Girard Cooter, RN 09/02/2016, 10:31 AM   Patient Details  Name: Ronald Gomez MRN: [id number] Date of Birth: May 14, 1951  Subjective/Objective:   Pt lives with spouse who will be available 24/7 when he is medically stable for discharge, also states other family members and friends are available as needed.                         Expected Discharge Plan:  Home/Self Care  Discharge planning Services  CM Consult  Status of Service:  Completed, signed off   Action/Plan: Pt tx from ICU to 2W on 09/02/16  Expected Discharge Date:    09/03/16              Expected Discharge Plan:  Home/Self Care  In-House Referral:     Discharge planning Services  CM Consult  Post Acute Care Choice:    Choice offered to:     DME Arranged:    DME Agency:     HH Arranged:    [place] Agency:     Status of Service:  Completed, signed off  If discussed at [employer] of Stay Meetings, dates discussed:    Discharge Disposition: home/self care   Additional Comments:  09/03/16- [place] RN, CM- pt for d/c home today- no CM needs noted - pt to return home with family.  Dawayne Patricia, RN 09/03/2016, 9:57 AM

## 2016-09-07 ENCOUNTER — Encounter: Payer: Self-pay | Admitting: Cardiovascular Disease

## 2016-09-07 NOTE — Progress Notes (Signed)
Thanks

## 2016-09-07 NOTE — Progress Notes (Signed)
Spoke with patient today. He had recent aortic valve replacement. He has gone into rate-controlled atrial fibrillation. Had this post-operatively but left the hospital in sinus rhythm. 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 He otherwise feels fine.   Advised start warfarin 5 mg daily today. FU coumadin clinic Monday and have EKG Monday.  Sherren Mocha 09/07/16 8:27 AM

## 2016-09-09 ENCOUNTER — Ambulatory Visit (INDEPENDENT_AMBULATORY_CARE_PROVIDER_SITE_OTHER): Payer: Self-pay | Admitting: *Deleted

## 2016-09-09 DIAGNOSIS — Z4802 Encounter for removal of sutures: Secondary | ICD-10-CM

## 2016-09-09 DIAGNOSIS — Z952 Presence of prosthetic heart valve: Secondary | ICD-10-CM

## 2016-09-09 DIAGNOSIS — I350 Nonrheumatic aortic (valve) stenosis: Secondary | ICD-10-CM

## 2016-09-09 NOTE — Progress Notes (Signed)
Mr. Radek returns s/p AVR for suture removal of two previous chest tube sutures. These were easily removed. These sites as well as the sternal incision are very well healed. Appetite and bowels are good.  He is only using Tylenol for pain. He does wish to drive and take an overnight trip to [place], but understands the rationale of not pursing these activities, although reluctantly. His wife is in the room to reinforce my advice. He will return as scheduled with a chest xray.

## 2016-09-09 NOTE — Progress Notes (Signed)
I discussed this pt with Dr Burt Knack and he said the pt took an extra Metoprolol and converted back into NSR. The pt did not start Coumadin.  EKG and coumadin clinic not needed per Dr Burt Knack.  The pt will keep scheduled follow-up with our office on 09/16/16.

## 2016-09-16 ENCOUNTER — Encounter: Payer: Self-pay | Admitting: Cardiology

## 2016-09-16 ENCOUNTER — Ambulatory Visit (INDEPENDENT_AMBULATORY_CARE_PROVIDER_SITE_OTHER): Payer: BLUE CROSS/BLUE SHIELD | Admitting: Cardiology

## 2016-09-16 VITALS — BP 136/84 | HR 74 | Ht 68.0 in | Wt 162.0 lb

## 2016-09-16 DIAGNOSIS — I480 Paroxysmal atrial fibrillation: Secondary | ICD-10-CM | POA: Diagnosis not present

## 2016-09-16 DIAGNOSIS — I10 Essential (primary) hypertension: Secondary | ICD-10-CM | POA: Diagnosis not present

## 2016-09-16 MED ORDER — AMIODARONE HCL 200 MG PO TABS: 200.0000 mg | ORAL_TABLET | Freq: Two times a day (BID) | ORAL | 6 refills | Status: DC

## 2016-09-16 MED ORDER — METOPROLOL SUCCINATE ER 25 MG PO TB24: ORAL_TABLET | ORAL | 6 refills | Status: DC

## 2016-09-16 NOTE — Patient Instructions (Signed)
Medication Instructions:  Decrease Amiodarone to 200 mg BID  Increase Metoprolol--take 25 mg every morning and 50 mg every evening   Follow-Up:  Your physician recommends that you schedule a follow-up appointment in: 8 weeks with Dr. Irish Lack.    Any Other Special Instructions will be listed below:  Please call [phone] if you have worsening cough or AFIB.    If you need a refill on your cardiac medications before your next appointment, please call your pharmacy.

## 2016-09-16 NOTE — Progress Notes (Signed)
09/16/2016 Ronald Pummel, MD   04/03/1951  [id number]  Primary Physician Ronald Stack, MD Primary Cardiologist: Dr. 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 Cyndia Gomez. Of note, prior to AVR, he had a LHC which showed no CAD.  His AVR was on 08/29/16. He received a 23 mm Edwards Magna-Ease pericardialvalve. His post surgical course was complicated by atrial fibrillation. However, he converted and was in NSR at time of discharge on 09/03/16. He called the office on 09/07/16, stating that he felt that he was back in atrial fibrillation. He was instructed to take an extra metoprolol and he successfully converted back to NSR. He had a second recurrence of an irregular heart rate, several days later, however it only lasted ~15 min. He denies any further recurrence. He has remained on Amiodarone. He has not required A/C as his valve is a tissue valve and duration of afib episodes, post discharge, have been of short duration. In retrospect, he feels that he has been doing too much since discharge. He has been playing his trumpets, which requires forceful inhalation/exhalation, valsalva. He has also not been mindful of caffieene consumption. He thinks this may have exacerbated things. He plans to take it easier going forward. He has concerns regarding the use of amiodarone. He denies any dyspnea but has had a cough since discharge. He is on an ARB. No ACE. He denies CP. EKG today demonstrates NSR. HR is in the mid 70s. BP in the [id number] systolic.    Current Meds  Medication Sig  . amiodarone (PACERONE) 200 MG tablet Take 1 tablet (200 mg total) by mouth 2 (two) times daily.    Marland Kitchen aspirin EC 325 MG EC tablet Take 1 tablet  (325 mg total) by mouth daily.  Marland Kitchen atorvastatin (LIPITOR) 10 MG tablet Take 1 tablet (10 mg total) by mouth daily.  Marland Kitchen losartan (COZAAR) 25 MG tablet Take 1 tablet (25 mg total) by mouth daily.  . metoprolol succinate (TOPROL-XL) 25 MG 24 hr tablet Take 25 mg by mouth in the morning and 50 mg in the evening.  . Omega-3 Fatty Acids (FISH OIL) 1000 MG CAPS Take 1,000 mg by mouth daily. Stopping prior to procedure  . vitamin C (ASCORBIC ACID) 500 MG tablet Take 500 mg by mouth daily. Stopping prior to procedure  . [DISCONTINUED] amiodarone (PACERONE) 200 MG tablet Take 1 tablet (200 mg total) by mouth 2 (two) times daily. ( for 5 days) then take 400 mg daily  . [DISCONTINUED] amiodarone (PACERONE) 400 MG tablet Take 1 tablet (400 mg total) by mouth 2 (two) times daily. ( for 5 days) then take 400 mg daily  . [DISCONTINUED] metoprolol succinate (TOPROL-XL) 25 MG 24 hr tablet Take 1 tablet (25 mg total) by mouth 2 (two) times daily. (Patient taking differently: Take 50 mg by mouth daily. )  . [DISCONTINUED] oxyCODONE (OXY IR/ROXICODONE) 5 MG immediate release tablet Take 1-2 tablets (5-10 mg total) by mouth every 6 (six) hours as needed for severe pain.  . [DISCONTINUED] zolpidem (AMBIEN) 10 MG tablet TAKE 1 TABLET EVERY DAY AS DIRECTED   Allergies  Allergen Reactions  . Lisinopril Rash and Other (See Comments)    TINGLING OF LIPS  Past Medical History:  Diagnosis Date  . Anxiety   . Aortic stenosis   . Dyspnea    with exertion  . Hypertension   . Insomnia    Family History  Problem Relation Age of Onset  . Hypertension Mother   . Heart disease Mother   . CAD Mother   . Cardiomyopathy Mother   . Diabetes Mother   . Brain cancer Father   . Colon cancer Neg Hx   . Heart attack Neg Hx   . Stroke Neg Hx    Past Surgical History:  Procedure Laterality Date  . AORTIC VALVE REPLACEMENT N/A 08/29/2016   Procedure: AORTIC VALVE REPLACEMENT (AVR);  Surgeon: Gaye Pollack, MD;  Location: [facility];  Service: Open Heart Surgery;  Laterality: N/A;  . CARDIAC CATHETERIZATION N/A 12/25/2015   Procedure: Right/Left Heart Cath and Coronary Angiography;  Surgeon: Jettie Booze, MD;  Location: [facility];  Service: Cardiovascular;  Laterality: N/A;  . SUPRAVALVULAR AORTIC STENOSIS REPAIR N/A 2006   not done per patiebt  . TEE WITHOUT CARDIOVERSION N/A 08/29/2016   Procedure: TRANSESOPHAGEAL ECHOCARDIOGRAM (TEE);  Surgeon: Gaye Pollack, MD;  Location: [facility];  Service: Open Heart Surgery;  Laterality: N/A;  . TONSILLECTOMY     Social History   Social History  . Marital status: Married    Spouse name: N/A  . Number of children: N/A  . Years of education: N/A   Occupational History  . Not on file.   Social History Main Topics  . Smoking status: Former Smoker    Years: 20.00    Types: Cigarettes    Quit date: 11/18/2000  . Smokeless tobacco: Never Used  . Alcohol use 12.6 oz/week    21 Cans of beer per week  . Drug use: No  . Sexual activity: Not on file   Other Topics Concern  . Not on file   Social History Narrative  . No narrative on file     Review of Systems: General: negative for chills, fever, night sweats or weight changes.  Cardiovascular: negative for chest pain, dyspnea on exertion, edema, orthopnea, palpitations, paroxysmal nocturnal dyspnea or shortness of breath Dermatological: negative for rash Respiratory: negative for cough or wheezing Urologic: negative for hematuria Abdominal: negative for nausea, vomiting, diarrhea, bright red blood per rectum, melena, or hematemesis Neurologic: negative for visual changes, syncope, or dizziness All other systems reviewed and are otherwise negative except as noted above.   Physical Exam:  Blood pressure 136/84, pulse 74, height 5\' 8"  (1.727 m), weight 162 lb (73.5 kg).  General appearance: alert, cooperative and no distress Neck: no carotid bruit and no JVD Lungs: clear to auscultation  bilaterally Heart: regular rate and rhythm, S1, S2 normal, no murmur, click, rub or gallop Extremities: extremities normal, atraumatic, no cyanosis or edema Pulses: 2+ and symmetric Skin: Skin color, texture, turgor normal. No rashes or lesions Neurologic: Grossly normal  EKG NSR.   ASSESSMENT AND PLAN:   1. Aortic Valve Disease: s/p recent tissue AVR on 08/29/16, per Dr. Cyndia Gomez. Stable. No CP or dyspnea. He has f/u with Dr. Cyndia Gomez on 10/02/16.   2. PAF: fist noted post op and several days post discharge. Episodes have been brief in duration. EKG today shows NSR. HR in the 70s. He has concerns regarding long term use of amiodarone. Mainly concerned about pulmonary toxicity. He has a cough but no dyspnea or wheezing. Lungs are CTAB. We will reduce dose of amiodarone down  from 400 mg BID to 200 mg BID. Will increase dose of metoprolol to 25 mg in the am and 50 mg at night. He will notify us if any recurrent arrhthymias. If his cough worsens of if development of dyspnea, we can order a f/u CXR and PFTs.   3. HTN: BP is well controlled.   4. HLD: continue statin therapy with Lipitor.   PLAN  F/u with Dr. Irish Lack in 8 weeks.   Lyda Jester PA-C 09/16/2016 12:05 PM

## 2016-10-01 ENCOUNTER — Other Ambulatory Visit: Payer: Self-pay | Admitting: Surgery

## 2016-10-01 DIAGNOSIS — Z952 Presence of prosthetic heart valve: Secondary | ICD-10-CM

## 2016-10-02 ENCOUNTER — Encounter: Payer: Self-pay | Admitting: Surgery

## 2016-10-02 ENCOUNTER — Ambulatory Visit (INDEPENDENT_AMBULATORY_CARE_PROVIDER_SITE_OTHER): Payer: Self-pay | Admitting: Surgery

## 2016-10-02 ENCOUNTER — Ambulatory Visit
Admission: RE | Admit: 2016-10-02 | Discharge: 2016-10-02 | Disposition: A | Discharge status: Home / Self Care | Payer: BLUE CROSS/BLUE SHIELD | Source: Ambulatory Visit | Attending: Surgery | Admitting: Surgery

## 2016-10-02 VITALS — BP 150/90 | HR 72 | Resp 20 | Ht 68.0 in | Wt 162.0 lb

## 2016-10-02 DIAGNOSIS — I351 Nonrheumatic aortic (valve) insufficiency: Secondary | ICD-10-CM

## 2016-10-02 DIAGNOSIS — Z952 Presence of prosthetic heart valve: Secondary | ICD-10-CM

## 2016-10-02 DIAGNOSIS — I350 Nonrheumatic aortic (valve) stenosis: Secondary | ICD-10-CM

## 2016-10-03 ENCOUNTER — Encounter: Payer: Self-pay | Admitting: Surgery

## 2016-10-03 NOTE — Progress Notes (Signed)
HPI:  Patient returns for routine postoperative follow-up having undergone AVR using a 23 mm Magna-Ease pericardial valve on 08/29/2016. The patient's early postoperative recovery while in the hospital was notable for development of postop atrial fibrillation. Since hospital discharge the patient reports that he has felt much better than he has in years. He is ambulating without chest pain or shortness of breath. After going home he had a couple episodes of irregular heart rate that were brief and went back to regular rhythm after metoprolol. He felt like he may have been doing too much activity at that time. He has had no further episodes. He has had some non-productive cough with deep breaths and was concerned about the possible effects of amiodarone. His does was decreased to 200 bid by cardiology at his last visit on 10/30.   Current Outpatient Prescriptions  Medication Sig Dispense Refill  . amiodarone (PACERONE) 200 MG tablet Take 1 tablet (200 mg total) by mouth 2 (two) times daily. 30 tablet 6  . aspirin EC 325 MG EC tablet Take 1 tablet (325 mg total) by mouth daily.    Marland Kitchen atorvastatin (LIPITOR) 10 MG tablet Take 1 tablet (10 mg total) by mouth daily. 90 tablet 3  . losartan (COZAAR) 25 MG tablet Take 1 tablet (25 mg total) by mouth daily. 90 tablet 3  . metoprolol succinate (TOPROL-XL) 25 MG 24 hr tablet Take 25 mg by mouth in the morning and 50 mg in the evening. 90 tablet 6  . Omega-3 Fatty Acids (FISH OIL) 1000 MG CAPS Take 1,000 mg by mouth daily. Stopping prior to procedure    . vitamin C (ASCORBIC ACID) 500 MG tablet Take 500 mg by mouth daily. Stopping prior to procedure     No current facility-administered medications for this visit.     Physical Exam: BP (!) 150/90 (BP Location: Right Arm, Patient Position: Sitting, Cuff Size: Small)   Pulse 72   Resp 20   Ht 5\' 8"  (1.727 m)   Wt 162 lb (73.5 kg)   SpO2 99% Comment: RA  BMI 24.63 kg/m  He looks well. Lung exam  is clear. Cardiac exam shows a regular rate and rhythm with normal heart sounds. Chest incision is healing well and sternum is stable. There is no peripheral edema.   Diagnostic Tests:  CLINICAL DATA:  Status post aortic valve replacement 1 month prior. Shortness of breath.  EXAM: CHEST  2 VIEW  COMPARISON:  September 03, 2016  FINDINGS: There is mild effusion loculated along the right major fissure. There are areas of mild scarring bilaterally. There is no edema or consolidation. Heart size and pulmonary vascularity are normal. Patient is status post aortic valve replacement. No adenopathy. There is degenerative change in thoracic spine.  IMPRESSION: A small amount of pleural fluid tracking along the right major fissure. No edema or consolidation. Mild scarring noted bilaterally. Cardiac silhouette within normal limits. Status aortic valve lungs.   Electronically Signed   By: Lowella Grip III M.D.   On: 10/02/2016 10:12     Impression:  Overall I think he is doing well. He had postop atrial fibrillation but has not noted any further episodes of irregular rhythm on the current dose of amiodarone. I think it can be decreased to 200 mg daily and then be discontinued at his next cardiology visit if his rhythm is stable. I encouraged him to continue walking. He is thinking about cardiac rehab but is very active on his  own. I told him he could drive his car but should not lift anything heavier than 10 lbs for three months postop.   Plan:  He will decrease the amiodarone to 200 mg daily and will follow up with cardiology on 11/08/2016. He will contact me if he has any problems with his incision.   Gaye Pollack, MD [facility] [phone]

## 2016-10-15 ENCOUNTER — Telehealth (HOSPITAL_COMMUNITY): Payer: Self-pay | Admitting: Cardiac Rehabilitation

## 2016-10-15 NOTE — Telephone Encounter (Signed)
pc to pt to discuss enrolling in cardiac rehab. Pt declined, he is exercising on his own.

## 2016-11-07 NOTE — Progress Notes (Signed)
Cardiology Office Note   Date:  11/08/2016   ID:  Ronald Pummel, MD, DOB 12-24-1950, MRN [id number]  PCP:  [name], MD     No chief complaint on file. 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 Cyndia Bent. Of note, prior to AVR, he had a LHC which showed no CAD.  His AVR was on 08/29/16. He received a 23 mm Edwards Magna-Ease pericardialvalve. His post surgical course was complicated by atrial fibrillation. However, he converted and was in NSR at time of discharge on 09/03/16. He called the office on 09/07/16, stating that he felt that he was back in atrial fibrillation. He was instructed to take an extra metoprolol and he successfully converted back to NSR. He had a second recurrence of an irregular heart rate, several days later, however it only lasted ~15 min. He denies any further recurrence. He has remained on Amiodarone. He has not required A/C as his valve is a tissue valve and duration of afib episodes, post discharge, have been of short duration.  He has not had AFib in the past 8 weeks.  He has tapered down his Amiodarone.  He is riding the bike several times a week.  Once mid January comes, he will do some more weights.  He wants to come off of Amio.  He feels better after the valve.    BP at home has been in the 130-140 range.  He is off of the amlodipine since discharge from the hospital.    Past Medical History:  Diagnosis Date  . Anxiety   . Aortic stenosis   . Dyspnea    with exertion  . Hypertension   . Insomnia     Past Surgical History:  Procedure Laterality Date  . AORTIC  VALVE REPLACEMENT N/A 08/29/2016   Procedure: AORTIC VALVE REPLACEMENT (AVR);  Surgeon: Gaye Pollack, MD;  Location: [facility];  Service: Open Heart Surgery;  Laterality: N/A;  . CARDIAC CATHETERIZATION N/A 12/25/2015   Procedure: Right/Left Heart Cath and Coronary Angiography;  Surgeon: Jettie Booze, MD;  Location: [facility];  Service: Cardiovascular;  Laterality: N/A;  . SUPRAVALVULAR AORTIC STENOSIS REPAIR N/A 2006   not done per patiebt  . TEE WITHOUT CARDIOVERSION N/A 08/29/2016   Procedure: TRANSESOPHAGEAL ECHOCARDIOGRAM (TEE);  Surgeon: Gaye Pollack, MD;  Location: [facility];  Service: Open Heart Surgery;  Laterality: N/A;  . TONSILLECTOMY       Current Outpatient Prescriptions  Medication Sig Dispense Refill  . amiodarone (PACERONE) 200 MG tablet Take 100 mg by mouth daily.    Marland Kitchen aspirin EC 325 MG EC tablet Take 1 tablet (325 mg total) by mouth daily.    Marland Kitchen atorvastatin (LIPITOR) 10 MG tablet Take 1 tablet (10 mg total) by mouth daily. 90 tablet 3  . losartan (COZAAR) 25 MG tablet Take 1 tablet (25 mg total) by mouth daily. 90 tablet 3  . metoprolol succinate (TOPROL-XL) 25 MG 24 hr tablet Take 25 mg by mouth in the morning and 50 mg in the evening. [place]  tablet 6  . Omega-3 Fatty Acids (FISH OIL) 1000 MG CAPS Take 1,000 mg by mouth daily. Stopping prior to procedure    . vitamin C (ASCORBIC ACID) 500 MG tablet Take 500 mg by mouth daily. Stopping prior to procedure     No current facility-administered medications for this visit.     Allergies:   Lisinopril    Social History:  The patient  reports that he quit smoking about 15 years ago. His smoking use included Cigarettes. He quit after 20.00 years of use. He has never used smokeless tobacco. He reports that he drinks about 12.6 oz of alcohol per week . He reports that he does not use drugs.   Family History:  The patient's family history includes Brain cancer in his father; CAD in his mother; Cardiomyopathy in his mother;  Diabetes in his mother; Heart disease in his mother; Hypertension in his mother.    ROS:  Please see the history of present illness.   Otherwise, review of systems are positive for decreased AFib.   All other systems are reviewed and negative.    PHYSICAL EXAM: VS:  BP 138/68   Pulse 68   Ht 5\' 8"  (1.727 m)   Wt 165 lb 12.8 oz (75.2 kg)   BMI 25.21 kg/m  , BMI Body mass index is 25.21 kg/m. GEN: Well nourished, well developed, in no acute distress  HEENT: normal  Neck: no JVD, carotid bruits, or masses Cardiac: RRR; 2/6 systolic murmur, no rubs, or gallops,no edema  Respiratory:  clear to auscultation bilaterally, normal work of breathing GI: soft, nontender, nondistended, + BS MS: no deformity or atrophy  Skin: warm and dry, no rash Neuro:  Strength and sensation are intact Psych: euthymic mood, full affect    Recent Labs: 08/30/2016: Magnesium 1.9 09/01/2016: ALT 21 09/03/2016: BUN 15; Creatinine, Ser 1.10; Hemoglobin 10.8; Platelets 193; Potassium 3.9; Sodium 136   Lipid Panel    Component Value Date/Time   CHOL 136 01/31/2016 0749   TRIG 79 01/31/2016 0749   HDL 55 01/31/2016 0749   CHOLHDL 2.5 01/31/2016 0749   VLDL 16 01/31/2016 0749   LDLCALC 65 01/31/2016 0749     Other studies Reviewed: Additional studies/ records that were reviewed today with results demonstrating: prior notes.   ASSESSMENT AND PLAN:  1. Aortic stenosis, s/p AVR: Doing well post-op.  Continue regular exercise. He will need SBE prophylaxis for dental appointments. 2. PAF: Stop amiodarone.  AFib sx have resolved.  Continue aspirin. 3. Hyperlipidemia: Well controlled in March 2017. To be checked with his primary care doctor. 4. HTN: Blood pressure slightly above target for him. Will increase losartan to 50 mg daily. Check electrolytes in a week. 5. 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 He has to wait 6 months after the surgery. He may need an echo, stress test and Holter  at that time. We'll wait to perform his first postsurgical echo until then.  6. Due to issues with his contract at work, he is not working.   Current medicines are reviewed at length with the patient today.  The patient concerns regarding his medicines were addressed.  The following changes have been made: As above  Labs/ tests ordered today include: BMet No orders of the defined types were placed in this encounter.   Recommend 150 minutes/week of aerobic exercise Low fat, low carb, high fiber diet recommended  Disposition:   FU in 6 months   Signed, Larae Grooms,  MD  11/08/2016 8:57 AM    [address], [place], [place]  [address] Phone: [phone]; Fax: [phone]

## 2016-11-08 ENCOUNTER — Encounter (INDEPENDENT_AMBULATORY_CARE_PROVIDER_SITE_OTHER): Payer: Self-pay

## 2016-11-08 ENCOUNTER — Ambulatory Visit (INDEPENDENT_AMBULATORY_CARE_PROVIDER_SITE_OTHER): Payer: BLUE CROSS/BLUE SHIELD | Admitting: Interventional Cardiology

## 2016-11-08 ENCOUNTER — Encounter: Payer: Self-pay | Admitting: Interventional Cardiology

## 2016-11-08 VITALS — BP 138/68 | HR 68 | Ht 68.0 in | Wt 165.8 lb

## 2016-11-08 DIAGNOSIS — I10 Essential (primary) hypertension: Secondary | ICD-10-CM | POA: Diagnosis not present

## 2016-11-08 DIAGNOSIS — E782 Mixed hyperlipidemia: Secondary | ICD-10-CM | POA: Diagnosis not present

## 2016-11-08 DIAGNOSIS — I480 Paroxysmal atrial fibrillation: Secondary | ICD-10-CM | POA: Diagnosis not present

## 2016-11-08 DIAGNOSIS — Z952 Presence of prosthetic heart valve: Secondary | ICD-10-CM | POA: Diagnosis not present

## 2016-11-08 DIAGNOSIS — Z79899 Other long term (current) drug therapy: Secondary | ICD-10-CM

## 2016-11-08 MED ORDER — METOPROLOL SUCCINATE ER 50 MG PO TB24: ORAL_TABLET | ORAL | 3 refills | Status: DC

## 2016-11-08 MED ORDER — LOSARTAN POTASSIUM 50 MG PO TABS: 50.0000 mg | ORAL_TABLET | Freq: Every day | ORAL | 3 refills | Status: DC

## 2016-11-08 NOTE — Patient Instructions (Addendum)
Medication Instructions:  Please increase Losartan to 50 mg a day. Stop Amiodarone. Continue all other medications as listed.  Labwork: Please have blood work in 1 week (BMP)  Follow-Up: Follow up in 6 months with Dr. Irish Lack.  You will receive a letter in the mail 2 months before you are due.  Please call us when you receive this letter to schedule your follow up appointment.  Thank you for choosing [facility]!!

## 2016-11-15 ENCOUNTER — Other Ambulatory Visit: Payer: BLUE CROSS/BLUE SHIELD | Admitting: *Deleted

## 2016-11-15 DIAGNOSIS — I10 Essential (primary) hypertension: Secondary | ICD-10-CM

## 2016-11-15 DIAGNOSIS — Z79899 Other long term (current) drug therapy: Secondary | ICD-10-CM

## 2016-11-15 DIAGNOSIS — I480 Paroxysmal atrial fibrillation: Secondary | ICD-10-CM

## 2016-11-15 LAB — BASIC METABOLIC PANEL
BUN: 23 mg/dL (ref 7–25)
CHLORIDE: 103 mmol/L (ref 98–110)
CO2: 23 mmol/L (ref 20–31)
Calcium: 9.1 mg/dL (ref 8.6–10.3)
Creat: 1.49 mg/dL — ABNORMAL HIGH (ref 0.70–1.25)
Glucose, Bld: 103 mg/dL — ABNORMAL HIGH (ref 65–99)
POTASSIUM: 4.9 mmol/L (ref 3.5–5.3)
SODIUM: 137 mmol/L (ref 135–146)

## 2016-11-20 ENCOUNTER — Telehealth: Payer: Self-pay | Admitting: *Deleted

## 2016-11-20 DIAGNOSIS — I480 Paroxysmal atrial fibrillation: Secondary | ICD-10-CM

## 2016-11-20 DIAGNOSIS — I10 Essential (primary) hypertension: Secondary | ICD-10-CM

## 2016-11-20 NOTE — Telephone Encounter (Signed)
I think we should repeat it Friday because if it is still elevated, we may have to go back down on the ARB dose that was increased at his last visit for BP control. I would like him to be adequately hydrated for this blood test.

## 2016-11-20 NOTE — Telephone Encounter (Signed)
Patient informed and verbalized understanding of plan. Patient said that he saw his PCP today and was already aware of the results. Patient said that his PCP wants the BMET repeated in 4 months. Patient is requesting the 4 mth repeat vs repeating the bmet this Friday. Patient said that if Dr. Irish Lack felt that it shouldn't wait for 4 months and wanted it this Friday that he would agree to doing it. Please advise if its okay to wait 4 mths for repeat bmet per PCP.

## 2016-11-20 NOTE — Telephone Encounter (Signed)
-----   Message from Jettie Booze, MD sent at 11/20/2016  3:22 PM EST ----- Drink plenty of water.  Repeat BMet on Friday of this week.  Cr slightly increased from baseline.

## 2016-11-20 NOTE — Telephone Encounter (Signed)
Patient informed and verbalized understanding of plan. 

## 2016-11-22 ENCOUNTER — Other Ambulatory Visit: Payer: BLUE CROSS/BLUE SHIELD | Admitting: *Deleted

## 2016-11-22 DIAGNOSIS — I10 Essential (primary) hypertension: Secondary | ICD-10-CM

## 2016-11-22 DIAGNOSIS — I480 Paroxysmal atrial fibrillation: Secondary | ICD-10-CM

## 2016-11-23 LAB — BASIC METABOLIC PANEL
BUN / CREAT RATIO: 11 (ref 10–24)
BUN: 17 mg/dL (ref 8–27)
CALCIUM: 8.9 mg/dL (ref 8.6–10.2)
CHLORIDE: 97 mmol/L (ref 96–106)
CO2: 21 mmol/L (ref 18–29)
CREATININE: 1.58 mg/dL — AB (ref 0.76–1.27)
GFR calc Af Amer: 53 mL/min/{1.73_m2} — ABNORMAL LOW (ref >59)
GFR calc non Af Amer: 46 mL/min/{1.73_m2} — ABNORMAL LOW (ref >59)
GLUCOSE: 89 mg/dL (ref 65–99)
Potassium: 4.6 mmol/L (ref 3.5–5.2)
Sodium: 136 mmol/L (ref 134–144)

## 2016-11-25 ENCOUNTER — Telehealth: Payer: Self-pay | Admitting: Interventional Cardiology

## 2016-11-25 ENCOUNTER — Other Ambulatory Visit: Payer: Self-pay

## 2016-11-25 DIAGNOSIS — I10 Essential (primary) hypertension: Secondary | ICD-10-CM

## 2016-11-25 MED ORDER — AMLODIPINE BESYLATE 5 MG PO TABS
5.0000 mg | ORAL_TABLET | Freq: Every day | ORAL | 0 refills | Status: DC
Start: 2016-11-25 — End: 2016-12-09

## 2016-11-25 MED ORDER — LOSARTAN POTASSIUM 25 MG PO TABS: 25.0000 mg | ORAL_TABLET | Freq: Every day | ORAL | 0 refills | Status: DC

## 2016-11-25 NOTE — Telephone Encounter (Signed)
**Note De-Identified Tenlee Wollin Obfuscation** Notes Recorded by Deliah Boston Terilyn Sano, LPN on [id number] at 579FGE PM EST The pt is advised and he verbalized understanding and is in agreement with the plan. BMET ordered and scheduled to be drawn next Tuesday on 1/16. The pt requested that I not send in RXs to his pharmacy as he states that he has plenty of both medications and the ability to cut his Norvasc 10 mg (will take 5 mg daily) and Losartan 50 mg (will take 25 mg daily) in half. Medications updated in the pts chart. ------  Notes Recorded by Jettie Booze, MD on 11/25/2016 at 2:47 PM EST Cr still elevated. Decrease losartan back to 25 mg daily. Add amlodipine 5 mg daily for BP control. Repeat BMet one week after change.

## 2016-11-25 NOTE — Telephone Encounter (Signed)
New message      Talk to the doctor regarding his lab results.  He states that his creatinine went up a lot after changing his losartan dosage.  He stopped taking the medication Saturday.  Please advise

## 2016-11-25 NOTE — Telephone Encounter (Signed)
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  He wants to know if his Losartan should be decreased back down to 25 mg daily (was increased to 50 mg at last OV on 12/22) as his labs were better at that dose.  Also he wants to know if he should have labs repeated.  Please advise.

## 2016-12-03 ENCOUNTER — Other Ambulatory Visit: Payer: BLUE CROSS/BLUE SHIELD | Admitting: *Deleted

## 2016-12-03 DIAGNOSIS — I10 Essential (primary) hypertension: Secondary | ICD-10-CM

## 2016-12-04 LAB — BASIC METABOLIC PANEL
BUN / CREAT RATIO: 12 (ref 10–24)
BUN: 16 mg/dL (ref 8–27)
CALCIUM: 8.8 mg/dL (ref 8.6–10.2)
CHLORIDE: 94 mmol/L — AB (ref 96–106)
CO2: 23 mmol/L (ref 18–29)
Creatinine, Ser: 1.32 mg/dL — ABNORMAL HIGH (ref 0.76–1.27)
GFR calc Af Amer: 66 mL/min/{1.73_m2} (ref >59)
GFR calc non Af Amer: 57 mL/min/{1.73_m2} — ABNORMAL LOW (ref >59)
GLUCOSE: 112 mg/dL — AB (ref 65–99)
Potassium: 5 mmol/L (ref 3.5–5.2)
Sodium: 133 mmol/L — ABNORMAL LOW (ref 134–144)

## 2016-12-09 ENCOUNTER — Other Ambulatory Visit: Payer: Self-pay

## 2016-12-09 MED ORDER — AMLODIPINE BESYLATE 10 MG PO TABS: 10.0000 mg | ORAL_TABLET | Freq: Every day | ORAL | 0 refills | Status: DC

## 2017-01-05 ENCOUNTER — Other Ambulatory Visit: Payer: Self-pay | Admitting: Interventional Cardiology

## 2017-01-07 NOTE — Telephone Encounter (Signed)
Medication Detail    Disp Refills Start End   metoprolol succinate (TOPROL-XL) 50 MG 24 hr tablet 135 tablet 3 11/08/2016    Sig: Take 25 mg by mouth in the morning and 50 mg in the evening.   E-Prescribing Status: Receipt confirmed by pharmacy (11/08/2016 9:14 AM EST)   Pharmacy   [id number] - [place], [address]

## 2017-01-07 NOTE — Telephone Encounter (Signed)
Deliah Boston Via, LPN     [id number] PM  Note    Notes Recorded by Deliah Boston Via, LPN on [id number] at [id number] PM EST The pt is advised and he verbalized understanding and is in agreement with the plan. BMET ordered and scheduled to be drawn next Tuesday on 1/16. The pt requested that I not send in RXs to his pharmacy as he states that he has plenty of both medications and the ability to cut his Norvasc 10 mg (will take 5 mg daily) and Losartan 50 mg (will take 25 mg daily) in half. Medications updated in the pts chart. ------  Notes Recorded by Jettie Booze, MD on 11/25/2016 at 2:47 PM EST Cr still elevated. Decrease losartan back to 25 mg daily. Add amlodipine 5 mg daily for BP control. Repeat BMet one week after change.

## 2017-02-17 ENCOUNTER — Telehealth: Payer: Self-pay | Admitting: Interventional Cardiology

## 2017-02-17 DIAGNOSIS — I10 Essential (primary) hypertension: Secondary | ICD-10-CM

## 2017-02-17 DIAGNOSIS — R600 Localized edema: Secondary | ICD-10-CM

## 2017-02-17 MED ORDER — FUROSEMIDE 20 MG PO TABS: 20.0000 mg | ORAL_TABLET | Freq: Every day | ORAL | 3 refills | Status: DC | PRN

## 2017-02-17 NOTE — Telephone Encounter (Signed)
OK to check BMet as well.  Determine any change in losartan based on labs.

## 2017-02-17 NOTE — Telephone Encounter (Signed)
Reviewed history and s/s with Dr Meda Coffee.  She gives orders for venous dopplers, Furosemide 20 mg a day and f/u appt in 3-4 days with BMP/BNP at that time.  Reviewed orders with patient who states he doesn't feel as though he needs venous dopplers as he does not have any leg pain and doesn't feels as though if he has a blood clot.  He doesn't feels as though he is having and s/s of CHF.  He is agreeable to trying the Furosemide.  He would like to know if he should continue the Losartan and would like to have blood work today.  Advised I can send the RX in for Furosemide.  I will forward this information to Dr Irish Lack as the patient wants his input on his condition.  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  I was able to schedule pt with Dr Irish Lack for 4/4 at 2:40 pm.

## 2017-02-17 NOTE — Telephone Encounter (Signed)
Pt c/b and requested to have venous dopplers

## 2017-02-17 NOTE — Telephone Encounter (Signed)
Spoke with pt who had a recent (October) Aortic valve replacement.  He has been doing very well, exercising "really hard" and has no problem until this AM.  He reports after being on his exercise bike for over an hour is noticed 2+ pitting edema.  He has never had this before that he knows of.  He denies any SOB or cough.  Doesn't know what his wt is as he doesn't have a good scale at home.  His last BMP results demonstrated at elevated BUN and creatine.  His losartan was decreased then.  He is also on amlodipine and metoprolol succinate.  He is not on any anticoagulation.  He is concerned about his kidney function and also not being on anticoagulation and that something maybe going wrong with his heart valve.  Will review with DOD for eval and possible orders for lab work.  Pt is aware I will c/b ASAP with further instructions.  His tone of voice and rapid speaking indicated he is very anxious.

## 2017-02-17 NOTE — Telephone Encounter (Signed)
Called pt back to advise OK to have BMP.  He states understanding and will come in in the AM for it to be drawn.  He is now reporting that he did have a lot of salt over the passed few days and a lot of the edema has come down since taking the lasix.  He now wished to hold off on the doppler study until after he sees Dr Irish Lack on Wednesday.

## 2017-02-17 NOTE — Telephone Encounter (Signed)
Pt c/o swelling: STAT is pt has developed SOB within 24 hours  1. How long have you been experiencing swelling? 02/16/2017  2. Where is the swelling located? legs 3.  Are you currently taking a "fluid pill"?no 4.  Are you currently SOB? no  5.  Have you traveled recently? No

## 2017-02-18 ENCOUNTER — Encounter (INDEPENDENT_AMBULATORY_CARE_PROVIDER_SITE_OTHER): Payer: Self-pay

## 2017-02-18 ENCOUNTER — Other Ambulatory Visit: Payer: BLUE CROSS/BLUE SHIELD | Admitting: *Deleted

## 2017-02-18 DIAGNOSIS — R600 Localized edema: Secondary | ICD-10-CM

## 2017-02-18 DIAGNOSIS — I10 Essential (primary) hypertension: Secondary | ICD-10-CM

## 2017-02-18 LAB — BASIC METABOLIC PANEL
BUN/Creatinine Ratio: 14 (ref 10–24)
BUN: 18 mg/dL (ref 8–27)
CO2: 19 mmol/L (ref 18–29)
Calcium: 9.3 mg/dL (ref 8.6–10.2)
Chloride: 94 mmol/L — ABNORMAL LOW (ref 96–106)
Creatinine, Ser: 1.31 mg/dL — ABNORMAL HIGH (ref 0.76–1.27)
GFR calc Af Amer: 67 mL/min/{1.73_m2} (ref >59)
GFR calc non Af Amer: 58 mL/min/{1.73_m2} — ABNORMAL LOW (ref >59)
GLUCOSE: 122 mg/dL — AB (ref 65–99)
POTASSIUM: 4.4 mmol/L (ref 3.5–5.2)
SODIUM: 136 mmol/L (ref 134–144)

## 2017-02-18 NOTE — Progress Notes (Signed)
Cardiology Office Note   Date:  02/19/2017   ID:  Ronald Pummel, MD, DOB Dec 25, 1950, MRN [id number]  PCP:  [name], MD     No chief complaint on file. 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 Cyndia Bent. Of note, prior to AVR, he had a LHC which showed no CAD.  His AVR was on 08/29/16. He received a 23 mm Edwards Magna-Ease pericardialvalve. His post surgical course was complicated by atrial fibrillation. However, he converted and was in NSR at time of discharge on 09/03/16. He called the office on 09/07/16, stating that he felt that he was back in atrial fibrillation. He was instructed to take an extra metoprolol and he successfully converted back to NSR. He had a second recurrence of an irregular heart rate, several days later, however it only lasted ~15 min. He denies any further recurrence. He has remained on Amiodarone. He has not required A/C as his valve is a tissue valve and duration of afib episodes, post discharge, have been of short duration.  No AFib sx even off of Amio.   He had some leg swelling.  He thinks it was related to salt intake.  He took Lasix for a few days and ankles came back to normal.  He continues to exercise on a treadmill.  Walked at 4.5 mph for an hour.    Past Medical History:  Diagnosis Date  . Anxiety   . Aortic stenosis   . Dyspnea    with exertion  . Hypertension   . Insomnia     Past Surgical History:  Procedure Laterality Date  . AORTIC VALVE REPLACEMENT N/A 08/29/2016   Procedure: AORTIC VALVE REPLACEMENT (AVR);  Surgeon: Gaye Pollack, MD;  Location:  [facility];  Service: Open Heart Surgery;  Laterality: N/A;  . CARDIAC CATHETERIZATION N/A 12/25/2015   Procedure: Right/Left Heart Cath and Coronary Angiography;  Surgeon: Jettie Booze, MD;  Location: [facility];  Service: Cardiovascular;  Laterality: N/A;  . SUPRAVALVULAR AORTIC STENOSIS REPAIR N/A 2006   not done per patiebt  . TEE WITHOUT CARDIOVERSION N/A 08/29/2016   Procedure: TRANSESOPHAGEAL ECHOCARDIOGRAM (TEE);  Surgeon: Gaye Pollack, MD;  Location: [facility];  Service: Open Heart Surgery;  Laterality: N/A;  . TONSILLECTOMY       Current Outpatient Prescriptions  Medication Sig Dispense Refill  . amLODipine (NORVASC) 10 MG tablet Take 1 tablet (10 mg total) by mouth daily. 30 tablet 0  . aspirin 81 MG chewable tablet Chew 81 mg by mouth daily.    Marland Kitchen atorvastatin (LIPITOR) 10 MG tablet TAKE 1 TABLET (10 MG TOTAL) BY MOUTH DAILY. 90 tablet 2  . furosemide (LASIX) 20 MG tablet Take 1 tablet (20 mg total) by mouth daily as needed. 30 tablet 3  . losartan (COZAAR) 25 MG tablet Take 1 tablet (25 mg total) by mouth daily. 90 tablet 0  . metoprolol succinate (TOPROL-XL) 50 MG 24 hr tablet Take 75 mg by mouth daily. Take with or immediately following a meal.    . Omega-3  Fatty Acids (FISH OIL) 1000 MG CAPS Take 1,000 mg by mouth daily. Stopping prior to procedure    . vitamin C (ASCORBIC ACID) 500 MG tablet Take 500 mg by mouth daily. Stopping prior to procedure     No current facility-administered medications for this visit.     Allergies:   Lisinopril    Social History:  The patient  reports that he quit smoking about 16 years ago. His smoking use included Cigarettes. He quit after 20.00 years of use. He has never used smokeless tobacco. He reports that he drinks about 12.6 oz of alcohol per week . He reports that he does not use drugs.   Family History:  The patient's family history includes Brain cancer in his father; CAD in his mother; Cardiomyopathy in his mother; Diabetes  in his mother; Heart disease in his mother; Hypertension in his mother.    ROS:  Please see the history of present illness.   Otherwise, review of systems are positive for decreased AFib.   All other systems are reviewed and negative.    PHYSICAL EXAM: VS:  BP 124/84   Pulse 84   Ht 5\' 8"  (1.727 m)   Wt 162 lb (73.5 kg)   BMI 24.63 kg/m  , BMI Body mass index is 24.63 kg/m. GEN: Well nourished, well developed, in no acute distress  HEENT: normal  Neck: no JVD, carotid bruits, or masses Cardiac: RRR; 2/6 systolic murmur, no rubs, or gallops,no edema  Respiratory:  clear to auscultation bilaterally, normal work of breathing GI: soft, nontender, nondistended, + BS MS: no deformity or atrophy  Skin: warm and dry, no rash Neuro:  Strength and sensation are intact Psych: euthymic mood, full affect    Recent Labs: 08/30/2016: Magnesium 1.9 09/01/2016: ALT 21 09/03/2016: Hemoglobin 10.8; Platelets 193 02/18/2017: BUN 18; Creatinine, Ser 1.31; Potassium 4.4; Sodium 136   Lipid Panel    Component Value Date/Time   CHOL 136 01/31/2016 0749   TRIG 79 01/31/2016 0749   HDL 55 01/31/2016 0749   CHOLHDL 2.5 01/31/2016 0749   VLDL 16 01/31/2016 0749   LDLCALC 65 01/31/2016 0749     Other studies Reviewed: Additional studies/ records that were reviewed today with results demonstrating: prior notes.   ASSESSMENT AND PLAN:  1. Aortic stenosis, s/p AVR: Doing well post-op.  Continue regular exercise. He will need SBE prophylaxis for dental appointments.  He needs an echo for baseline post surgery.  Leg swelling has resolved with Lasix. He will use this when necessary only. Decrease salt intake. 2. PAF: Stopped amiodarone.  AFib sx have resolved.  Continue aspirin.  3. Hyperlipidemia: Well controlled in March 2017. To be checked with his primary care doctor. 4. HTN: Blood pressure controlled.  Electrolytes stable.  5. 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 He has to  wait 6 months after the valve surgery. He may need an echo, stress test and Holter at that time. 6. Due to issues with his contract at work, he is still not working.   Current medicines are reviewed at length with the patient today.  The patient concerns regarding his medicines were addressed.  The following changes have been made: As above  Labs/ tests ordered today include: BMet No orders of the defined types were placed in this encounter.   Recommend 150 minutes/week of aerobic exercise Low fat, low carb, high fiber diet recommended  Disposition:   FU in 6 months if no issues on the  echo   Signed, Larae Grooms, MD  02/19/2017 3:04 PM    [facility] [place], [place], [place]  [address] Phone: [phone]; Fax: [phone]

## 2017-02-19 ENCOUNTER — Encounter: Payer: Self-pay | Admitting: Interventional Cardiology

## 2017-02-19 ENCOUNTER — Encounter (INDEPENDENT_AMBULATORY_CARE_PROVIDER_SITE_OTHER): Payer: Self-pay

## 2017-02-19 ENCOUNTER — Ambulatory Visit (INDEPENDENT_AMBULATORY_CARE_PROVIDER_SITE_OTHER): Payer: BLUE CROSS/BLUE SHIELD | Admitting: Interventional Cardiology

## 2017-02-19 VITALS — BP 124/84 | HR 84 | Ht 68.0 in | Wt 162.0 lb

## 2017-02-19 DIAGNOSIS — E782 Mixed hyperlipidemia: Secondary | ICD-10-CM | POA: Diagnosis not present

## 2017-02-19 DIAGNOSIS — Z952 Presence of prosthetic heart valve: Secondary | ICD-10-CM

## 2017-02-19 DIAGNOSIS — I10 Essential (primary) hypertension: Secondary | ICD-10-CM

## 2017-02-19 DIAGNOSIS — M7989 Other specified soft tissue disorders: Secondary | ICD-10-CM

## 2017-02-19 DIAGNOSIS — I480 Paroxysmal atrial fibrillation: Secondary | ICD-10-CM

## 2017-02-19 NOTE — Patient Instructions (Signed)
Medication Instructions:  Your physician recommends that you continue on your current medications as directed. Please refer to the Current Medication list given to you today.   Labwork: None Ordered.  Testing/Procedures: Your physician has requested that you have an echocardiogram. Echocardiography is a painless test that uses sound waves to create images of your heart. It provides your doctor with information about the size and shape of your heart and how well your heart's chambers and valves are working. This procedure takes approximately one hour. There are no restrictions for this procedure.    Follow-Up: Keep follow up appointment 05/13/2017 at 8:40 am.  Any Other Special Instructions Will Be Listed Below (If Applicable).     If you need a refill on your cardiac medications before your next appointment, please call your pharmacy.

## 2017-03-06 ENCOUNTER — Ambulatory Visit (HOSPITAL_COMMUNITY): Payer: BLUE CROSS/BLUE SHIELD | Attending: Cardiology

## 2017-03-06 ENCOUNTER — Other Ambulatory Visit: Payer: Self-pay

## 2017-03-06 ENCOUNTER — Other Ambulatory Visit: Payer: Self-pay | Admitting: Interventional Cardiology

## 2017-03-06 DIAGNOSIS — Z953 Presence of xenogenic heart valve: Secondary | ICD-10-CM | POA: Insufficient documentation

## 2017-03-06 DIAGNOSIS — I4891 Unspecified atrial fibrillation: Secondary | ICD-10-CM | POA: Insufficient documentation

## 2017-03-06 DIAGNOSIS — I350 Nonrheumatic aortic (valve) stenosis: Secondary | ICD-10-CM | POA: Diagnosis not present

## 2017-03-06 DIAGNOSIS — I119 Hypertensive heart disease without heart failure: Secondary | ICD-10-CM | POA: Diagnosis not present

## 2017-03-06 DIAGNOSIS — E785 Hyperlipidemia, unspecified: Secondary | ICD-10-CM | POA: Diagnosis not present

## 2017-03-06 DIAGNOSIS — Z87891 Personal history of nicotine dependence: Secondary | ICD-10-CM | POA: Diagnosis not present

## 2017-03-06 LAB — ECHOCARDIOGRAM COMPLETE
AO mean calculated velocity dopler: 166 cm/s
AOASC: 35 cm
AV Peak grad: 28 mmHg
AV pk vel: 265 cm/s
AVCELMEANRAT: 0.47
AVG: 13 mmHg
Ao pk vel: 0.48 m/s
Area-P 1/2: 2.72 cm2
CHL CUP MV DEC (S): 278
E decel time: 278 msec
EERAT: 7.62
FS: 38 % (ref 28–44)
IV/PV OW: 0.91
LA ID, A-P, ES: 42 mm
LA diam end sys: 42 mm
LA diam index: 2.25 cm/m2
LA vol index: 27.7 mL/m2
LAVOL: 51.8 mL
LAVOLA4C: 51.7 mL
LV E/e' medial: 7.62
LV E/e'average: 7.62
LV PW d: 11 mm — AB (ref 0.6–1.1)
LVELAT: 10 cm/s
LVOT VTI: 29.1 cm
LVOT peak VTI: 0.56 cm
LVOT peak grad rest: 7 mmHg
LVOTPV: 128 cm/s
MV Peak grad: 2 mmHg
MV pk E vel: 76.2 m/s
MVPKAVEL: 85.9 m/s
P 1/2 time: 81 ms
RV LATERAL S' VELOCITY: 11.1 cm/s
TAPSE: 18.7 mm
TDI e' lateral: 10
TDI e' medial: 6.85
VTI: 52.1 cm

## 2017-03-06 MED ORDER — LOSARTAN POTASSIUM 25 MG PO TABS: 25.0000 mg | ORAL_TABLET | Freq: Every day | ORAL | 3 refills | Status: DC

## 2017-04-04 ENCOUNTER — Other Ambulatory Visit: Payer: Self-pay | Admitting: Interventional Cardiology

## 2017-05-13 ENCOUNTER — Ambulatory Visit: Payer: BLUE CROSS/BLUE SHIELD | Admitting: Interventional Cardiology

## 2017-06-09 ENCOUNTER — Telehealth: Payer: Self-pay | Admitting: Interventional Cardiology

## 2017-06-09 ENCOUNTER — Other Ambulatory Visit: Payer: BLUE CROSS/BLUE SHIELD | Admitting: *Deleted

## 2017-06-09 DIAGNOSIS — R6889 Other general symptoms and signs: Secondary | ICD-10-CM

## 2017-06-09 LAB — CBC WITH DIFFERENTIAL/PLATELET
BASOS ABS: 0.1 10*3/uL (ref 0.0–0.2)
Basos: 1 %
EOS (ABSOLUTE): 0.1 10*3/uL (ref 0.0–0.4)
EOS: 1 %
Hematocrit: 37.5 % (ref 37.5–51.0)
Hemoglobin: 13.4 g/dL (ref 13.0–17.7)
Lymphocytes Absolute: 0.5 10*3/uL — ABNORMAL LOW (ref 0.7–3.1)
Lymphs: 6 %
MCH: 32.8 pg (ref 26.6–33.0)
MCHC: 35.7 g/dL (ref 31.5–35.7)
MCV: 92 fL (ref 79–97)
MONOS ABS: 0.5 10*3/uL (ref 0.1–0.9)
Monocytes: 6 %
NEUTROS PCT: 86 %
Neutrophils Absolute: 7.4 10*3/uL — ABNORMAL HIGH (ref 1.4–7.0)
Platelets: 247 10*3/uL (ref 150–379)
RBC: 4.09 x10E6/uL — ABNORMAL LOW (ref 4.14–5.80)
RDW: 13 % (ref 12.3–15.4)
WBC: 8.5 10*3/uL (ref 3.4–10.8)

## 2017-06-09 NOTE — Telephone Encounter (Signed)
New Message     Pt states his heart murmur is getting louder , he is feeling very tired.  He wants brought into the office ASAP for lab work. This problem started a few weeks ago after he had valve replacement in April , pt states he was a heavy drinker until 2 weeks ago and he gave up all alcohol.  Offered pt a appt with APP on 07/02/17 and he refused wants brought in with Dr Irish Lack or Dr Marlou Porch ASAP

## 2017-06-09 NOTE — Telephone Encounter (Signed)
Patient complains of activity intolerance and fatigue for several weeks. He could barely work out this morning. He also states he can't hit the higher notes on his trumpet anymore.  He had an AVR in October and subsequent ECHO in April showed normally functioning prosthetic valve. He states he had a "hemorrhagic drinking spell" a couple weeks ago after a period of heavy drinking. At this time, he saw Dr. Sharlett Iles and checked CBC - he reports it was normal. He has since stopped drinking all alcohol. He requests a repeat CBC to check for anemia now as he hopes he is anemic and his problems aren't originating from his valve. He also requests an appointment with Dr. Irish Lack or Dr. Marlou Porch for evaluation.  Dr. Irish Lack is out of the office this week. Discussed with Georgana Curio, office supervisor, who gave permission to schedule on Dr. Marlou Porch' schedule tomorrow.  Spoke with Dr. Tamala Julian, DOD today, who gave verbal for CBC to be drawn today. Confirmed with patient he will come in today for CBC. Confirmed appointment with Dr. Marlou Porch tomorrow.  He was grateful for assistance and agrees with treatment plan.

## 2017-06-10 ENCOUNTER — Other Ambulatory Visit: Payer: Self-pay

## 2017-06-10 ENCOUNTER — Encounter: Payer: Self-pay | Admitting: Cardiology

## 2017-06-10 ENCOUNTER — Ambulatory Visit (HOSPITAL_COMMUNITY): Payer: BLUE CROSS/BLUE SHIELD | Attending: Cardiology

## 2017-06-10 ENCOUNTER — Other Ambulatory Visit: Payer: Self-pay | Admitting: Cardiology

## 2017-06-10 ENCOUNTER — Ambulatory Visit (INDEPENDENT_AMBULATORY_CARE_PROVIDER_SITE_OTHER): Payer: BLUE CROSS/BLUE SHIELD | Admitting: Cardiology

## 2017-06-10 VITALS — BP 102/70 | HR 72 | Ht 68.0 in | Wt 159.0 lb

## 2017-06-10 DIAGNOSIS — R5383 Other fatigue: Secondary | ICD-10-CM

## 2017-06-10 DIAGNOSIS — Z952 Presence of prosthetic heart valve: Secondary | ICD-10-CM

## 2017-06-10 DIAGNOSIS — I10 Essential (primary) hypertension: Secondary | ICD-10-CM | POA: Diagnosis not present

## 2017-06-10 DIAGNOSIS — Z953 Presence of xenogenic heart valve: Secondary | ICD-10-CM | POA: Insufficient documentation

## 2017-06-10 LAB — ECHOCARDIOGRAM LIMITED
Height: 68 in
WEIGHTICAEL: 2544 [oz_av]

## 2017-06-10 MED ORDER — AMLODIPINE BESYLATE 5 MG PO TABS
5.0000 mg | ORAL_TABLET | Freq: Every day | ORAL | 3 refills | Status: DC
Start: 2017-06-10 — End: 2017-09-15

## 2017-06-10 NOTE — Progress Notes (Signed)
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  Has a previous long-standing history of aortic stenosis and suspected bicuspid aortic valve. Dr. Cyndia Bent replaced aortic valve, left heart catheterization prior showed no CAD. This was performed on 08/29/16.  23 mm Edwards paracardial valve. He did have postoperative atrial fibrillation, converted prior to discharge. Once again felt it in late October to get extra metoprolol and converted back to sinus rhythm. Stopped amiodarone.  Had an episode of excessive alcohol binge. He gave up all alcohol recently after a possible bleeding episode. He was feeling very tired, but his murmur was getting louder. Hemoglobin was stable. He went through withdrawals he states. He was drinking up to 6 beers a day and a quart of heavy alcohol week. He states he was self-medicating. Anxious.  BP 102 today. Going up stairs mild SOB. [place]yne-  Increased effort.   Past Medical History:  Diagnosis Date  . Anxiety   . Aortic stenosis   . Dyspnea    with exertion  . Hypertension   . Insomnia     Past Surgical History:  Procedure Laterality Date  . AORTIC VALVE REPLACEMENT N/A 08/29/2016   Procedure: AORTIC VALVE REPLACEMENT (AVR);  Surgeon: Ronald Pollack, MD;  Location: [facility];  Service: Open Heart Surgery;  Laterality: N/A;  . CARDIAC CATHETERIZATION N/A 12/25/2015   Procedure: Right/Left Heart Cath and Coronary Angiography;  Surgeon: Ronald Booze, MD;  Location: [facility];  Service: Cardiovascular;  Laterality: N/A;  . SUPRAVALVULAR AORTIC STENOSIS REPAIR N/A 2006   not done per patiebt  . TEE  WITHOUT CARDIOVERSION N/A 08/29/2016   Procedure: TRANSESOPHAGEAL ECHOCARDIOGRAM (TEE);  Surgeon: Ronald Pollack, MD;  Location: [facility];  Service: Open Heart Surgery;  Laterality: N/A;  . TONSILLECTOMY      Current Medications: Current Meds  Medication Sig  . aspirin 81 MG chewable tablet Chew 81 mg by mouth daily.  Marland Kitchen atorvastatin (LIPITOR) 10 MG tablet TAKE 1 TABLET (10 MG TOTAL) BY MOUTH DAILY.  . furosemide (LASIX) 20 MG tablet TAKE 1 TABLET (20 MG TOTAL) BY MOUTH DAILY AS NEEDED.  Marland Kitchen losartan (COZAAR) 25 MG tablet Take 1 tablet (25 mg total) by mouth daily.  . metoprolol succinate (TOPROL-XL) 50 MG 24 hr tablet Take 75 mg by mouth daily. Take with or immediately following a meal.  . Omega-3 Fatty Acids (FISH OIL) 1000 MG CAPS Take 1,000 mg by mouth daily. Stopping prior to procedure  . tamsulosin (FLOMAX) 0.4 MG CAPS capsule Take 0.4 mg by mouth daily.  . vitamin C (ASCORBIC ACID) 500 MG tablet Take 500 mg by mouth daily. Stopping prior to procedure  . [DISCONTINUED] amLODipine (NORVASC) 10 MG tablet TAKE 1 TABLET (10 MG TOTAL) BY MOUTH DAILY.     Allergies:   Lisinopril   Social History   Social History  . Marital status: Married    Spouse name: N/A  . Number of children: N/A  . Years of education: N/A   Social History Main Topics  . Smoking status: Former Smoker    Years: 20.00  Types: Cigarettes    Quit date: 11/18/2000  . Smokeless tobacco: Never Used  . Alcohol use 12.6 oz/week    21 Cans of beer per week  . Drug use: No  . Sexual activity: Not Asked   Other Topics Concern  . None   Social History Narrative  . None     Family History: The patient's family history includes Brain cancer in his father; CAD in his mother; Cardiomyopathy in his mother; Diabetes in his mother; Heart disease in his mother; Hypertension in his mother. There is no history of Colon cancer, Heart attack, or Stroke. ROS:   Please see the history of present illness.     All other systems  reviewed and are negative.  EKGs/Labs/Other Studies Reviewed:    The following studies were reviewed today:  ECHO 03/06/17 - Left ventricle: The cavity size was normal. Systolic function was   normal. The estimated ejection fraction was in the range of 60%   to 65%. Wall motion was normal; there were no regional wall   motion abnormalities. Doppler parameters are consistent with   abnormal left ventricular relaxation (grade 1 diastolic   dysfunction). - Aortic valve: A bioprosthesis was present and functioning   normally. Peak velocity (S): 265 cm/s. Mean gradient (S): 13 mm   Hg. - Mitral valve: There was trivial regurgitation. - Left atrium: Volume/bsa, ES (1-plane Simpson&'s, A4C): 27.4   ml/m^2. - Right ventricle: The cavity size was mildly dilated. Wall   thickness was normal. Systolic function was normal. - Tricuspid valve: There was trivial regurgitation.  Impressions:  - When compared to prior echocardiogram, bioprosthetic aortic valve   is now present.   EKG:  EKG is ordered today.  The ekg ordered today demonstrates sinus rhythm 72 no other abnormalities. Personally viewed from 06/10/17.  Recent Labs: 08/30/2016: Magnesium 1.9 09/01/2016: ALT 21 02/18/2017: BUN 18; Creatinine, Ser 1.31; Potassium 4.4; Sodium 136 06/09/2017: Hemoglobin 13.4; Platelets 247  Recent Lipid Panel    Component Value Date/Time   CHOL 136 01/31/2016 0749   TRIG 79 01/31/2016 0749   HDL 55 01/31/2016 0749   CHOLHDL 2.5 01/31/2016 0749   VLDL 16 01/31/2016 0749   LDLCALC 65 01/31/2016 0749    Physical Exam:    VS:  BP 102/70   Pulse 72   Ht 5\' 8"  (1.727 m)   Wt 159 lb (72.1 kg)   SpO2 97%   BMI 24.18 kg/m     Wt Readings from Last 3 Encounters:  06/10/17 159 lb (72.1 kg)  02/19/17 162 lb (73.5 kg)  11/08/16 165 lb 12.8 oz (75.2 kg)     GEN:  Well nourished, well developed in no acute distress HEENT: Normal NECK: No JVD; No carotid bruits LYMPHATICS: No  lymphadenopathy CARDIAC: RRR, 2/6 systolic murmur no, rubs, gallops RESPIRATORY:  Clear to auscultation without rales, wheezing or rhonchi  ABDOMEN: Soft, non-tender, non-distended MUSCULOSKELETAL:  No edema; No deformity  SKIN: Warm and dry NEUROLOGIC:  Alert and oriented x 3 PSYCHIATRIC:  Normal affect   ASSESSMENT:    1. S/P AVR   2. Other fatigue   3. Essential hypertension    PLAN:    In order of problems listed above:  Aortic valve replacement  - Thankfully hemoglobin now is 13.4. Excellent.  - We'll obtain a limited echocardiogram to evaluate his aortic valve for patency. Spoke to bili in clinic who performed his last echocardiogram and he will be able to work him in to schedule  today. Continue with aspirin. He is very worried about having to undergo surgery again.  Fatigue  - Certainly could be related to his current alcohol withdrawal. Hemoglobin is reassuring.  - Checking echocardiogram to ensure proper function.  Essential hypertension  - Blood pressure is slightly low today. We will decrease amlodipine to 5. If his blood pressure remains low, he may discontinue. He has lost about 6 pounds since stopping alcohol. No edema.  Medication Adjustments/Labs and Tests Ordered: Current medicines are reviewed at length with the patient today.  Concerns regarding medicines are outlined above.  Orders Placed This Encounter  Procedures  . EKG 12-Lead  . ECHOCARDIOGRAM LIMITED   Meds ordered this encounter  Medications  . amLODipine (NORVASC) 5 MG tablet    Sig: Take 1 tablet (5 mg total) by mouth daily.    Dispense:  90 tablet    Refill:  3    Signed, Candee Furbish, MD  06/10/2017 3:51 PM    [facility]

## 2017-06-10 NOTE — Patient Instructions (Addendum)
Medication Instructions:  Decrease amlodipine to 5 mg daily  Labwork: none  Testing/Procedures: Your physician has requested that you have an echocardiogram. Echocardiography is a painless test that uses sound waves to create images of your heart. It provides your doctor with information about the size and shape of your heart and how well your heart's chambers and valves are working. This procedure takes approximately one hour. There are no restrictions for this procedure.  TODAY  Follow-Up: With Dr Irish Lack in October 2018  Any Other Special Instructions Will Be Listed Below (If Applicable).     If you need a refill on your cardiac medications before your next appointment, please call your pharmacy.

## 2017-09-14 NOTE — Progress Notes (Signed)
Cardiology Office Note   Date:  09/15/2017   ID:  Ronald Pummel, Ronald Gomez, DOB 10-31-1951, MRN [id number]  PCP:  [name], Ronald Gomez    No chief complaint on file.  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 Cyndia Bent. Of note, prior to AVR, he had a LHC which showed no CAD.  His AVR was on 08/29/16. He received a 23 mm Edwards Magna-Ease pericardialvalve. His post surgical course was complicated by atrial fibrillation. However, he converted andwas in NSR at time of discharge on 09/03/16. He called the office on 09/07/16, stating that he felt that he was back in atrial fibrillation. He was instructed to take an extra metoprolol and he successfully converted back to NSR. He had a second recurrence of an irregular heart rate, several days later, however it only lasted ~15 min.  He had some leg swelling in the past.  He thinks it was related to salt intake.  He took Lasix for a few days and ankles came back to normal.  In July 2018, he noticed some decreased exercise tolerance.  He decreased his alcohol intake. He tried Lasix without relief. He had an echo and the valve was ok.  He was having some muscle pain.  He stopped his statin, and feels that over time, his exercise tolerance increased.    He is using the treadmill regularly.  4 mph, 10-12% incline and tolerates this well.   Denies : Chest pain. Dizziness. Leg edema. Nitroglycerin use. Orthopnea. Palpitations. Paroxysmal nocturnal dyspnea. Shortness of breath. Syncope.   BP at home has been well controlled.    Past Medical History:  Diagnosis Date  . Anxiety   . Aortic  stenosis   . Dyspnea    with exertion  . Hypertension   . Insomnia     Past Surgical History:  Procedure Laterality Date  . AORTIC VALVE REPLACEMENT N/A 08/29/2016   Procedure: AORTIC VALVE REPLACEMENT (AVR);  Surgeon: Gaye Pollack, Ronald Gomez;  Location: [facility];  Service: Open Heart Surgery;  Laterality: N/A;  . CARDIAC CATHETERIZATION N/A 12/25/2015   Procedure: Right/Left Heart Cath and Coronary Angiography;  Surgeon: Jettie Booze, Ronald Gomez;  Location: [facility];  Service: Cardiovascular;  Laterality: N/A;  . SUPRAVALVULAR AORTIC STENOSIS REPAIR N/A 2006   not done per patiebt  . TEE WITHOUT CARDIOVERSION N/A 08/29/2016   Procedure: TRANSESOPHAGEAL ECHOCARDIOGRAM (TEE);  Surgeon: Gaye Pollack, Ronald Gomez;  Location: [facility];  Service: Open Heart Surgery;  Laterality: N/A;  . TONSILLECTOMY       Current Outpatient Prescriptions  Medication Sig Dispense Refill  . amLODipine (NORVASC) 10 MG tablet Take 10 mg by mouth daily.  3  . aspirin 81 MG chewable tablet Chew 81 mg by mouth daily.    . metoprolol succinate (TOPROL-XL) 50 MG 24 hr tablet Take 75 mg by mouth daily. Take with or immediately following a meal.    . Omega-3 Fatty Acids (FISH OIL) 1000 MG CAPS Take 1,000 mg by mouth daily. Stopping prior to procedure    . vitamin C (ASCORBIC ACID)  500 MG tablet Take 500 mg by mouth daily. Stopping prior to procedure    . furosemide (LASIX) 20 MG tablet TAKE 1 TABLET (20 MG TOTAL) BY MOUTH DAILY AS NEEDED. 30 tablet 8  . losartan (COZAAR) 25 MG tablet Take 1 tablet (25 mg total) by mouth daily. 90 tablet 3   No current facility-administered medications for this visit.     Allergies:   Lisinopril    Social History:  The patient  reports that he quit smoking about 16 years ago. His smoking use included Cigarettes. He quit after 20.00 years of use. He has never used smokeless tobacco. He reports that he drinks about 12.6 oz of alcohol per week . He reports that he does not use drugs.    Family History:  The patient's family history includes Brain cancer in his father; CAD in his mother; Cardiomyopathy in his mother; Diabetes in his mother; Heart disease in his mother; Hypertension in his mother.    ROS:  Please see the history of present illness.   Otherwise, review of systems are positive for improved exercise tolerance.   All other systems are reviewed and negative.    PHYSICAL EXAM: VS:  BP 122/84   Pulse 74   Ht 5\' 8"  (1.727 m)   Wt 163 lb 9.6 oz (74.2 kg)   SpO2 99%   BMI 24.88 kg/m  , BMI Body mass index is 24.88 kg/m. GEN: Well nourished, well developed, in no acute distress  HEENT: normal  Neck: no JVD, carotid bruits, or masses Cardiac: RRR; 3/6 systolic murmur, no rubs, or gallops,no edema  Respiratory:  clear to auscultation bilaterally, normal work of breathing GI: soft, nontender, nondistended, + BS MS: no deformity or atrophy  Skin: warm and dry, no rash Neuro:  Strength and sensation are intact Psych: euthymic mood, full affect     Recent Labs: 02/18/2017: BUN 18; Creatinine, Ser 1.31; Potassium 4.4; Sodium 136 06/09/2017: Hemoglobin 13.4; Platelets 247   Lipid Panel    Component Value Date/Time   CHOL 136 01/31/2016 0749   TRIG 79 01/31/2016 0749   HDL 55 01/31/2016 0749   CHOLHDL 2.5 01/31/2016 0749   VLDL 16 01/31/2016 0749   LDLCALC 65 01/31/2016 0749     Other studies Reviewed: Additional studies/ records that were reviewed today with results demonstrating: May 2018 LDL in 70. 2018 echo result reviewed   ASSESSMENT AND PLAN:  1. Aortic stenosis, s/p AVR: Continue ABx with dental treatment.  Continue low-dose aspirin. 2. PAF: No sx of AFib.  THis happened post surgery. 3. Hyperlipidemia: Now off of atorvastatin.  Recheck lipids today.  Consider restart of atorvastatin if LDL has increased significantly. 4. HTN: Well controlled.  Continue current meds. 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  He thinks it would be quite expensive to try and pursue this.   Current medicines are reviewed at length with the patient today.  The patient concerns regarding his medicines were addressed.  The following changes have been made:  No change  Labs/ tests ordered today include:  No orders of the defined types were placed in this encounter.   Recommend 150 minutes/week of aerobic exercise Low fat, low carb, high fiber diet recommended  Disposition:   FU in 1 year   Signed, Larae Grooms, Ronald Gomez  09/15/2017 9:44 AM    [facility] [address], [place], [place]  [address] Phone: [phone]; Fax: [phone]

## 2017-09-15 ENCOUNTER — Encounter: Payer: Self-pay | Admitting: Interventional Cardiology

## 2017-09-15 ENCOUNTER — Ambulatory Visit (INDEPENDENT_AMBULATORY_CARE_PROVIDER_SITE_OTHER): Payer: BLUE CROSS/BLUE SHIELD | Admitting: Interventional Cardiology

## 2017-09-15 VITALS — BP 122/84 | HR 74 | Ht 68.0 in | Wt 163.6 lb

## 2017-09-15 DIAGNOSIS — I10 Essential (primary) hypertension: Secondary | ICD-10-CM | POA: Diagnosis not present

## 2017-09-15 DIAGNOSIS — I480 Paroxysmal atrial fibrillation: Secondary | ICD-10-CM

## 2017-09-15 DIAGNOSIS — E782 Mixed hyperlipidemia: Secondary | ICD-10-CM | POA: Diagnosis not present

## 2017-09-15 DIAGNOSIS — Z952 Presence of prosthetic heart valve: Secondary | ICD-10-CM

## 2017-09-15 LAB — LIPID PANEL
Chol/HDL Ratio: 4.3 ratio (ref 0.0–5.0)
Cholesterol, Total: 167 mg/dL (ref 100–199)
HDL: 39 mg/dL — ABNORMAL LOW (ref >39)
LDL CALC: 109 mg/dL — AB (ref 0–99)
Triglycerides: 94 mg/dL (ref 0–149)
VLDL CHOLESTEROL CAL: 19 mg/dL (ref 5–40)

## 2017-09-15 NOTE — Patient Instructions (Signed)
Medication Instructions:  Your physician recommends that you continue on your current medications as directed. Please refer to the Current Medication list given to you today.   Labwork: Today for fasting lipids  Testing/Procedures: None ordered   Follow-Up: Your physician wants you to follow-up in: 1 year with Dr. Irish Lack. You will receive a reminder letter in the mail two months in advance. If you don't receive a letter, please call our office to schedule the follow-up appointment.   Any Other Special Instructions Will Be Listed Below (If Applicable).     If you need a refill on your cardiac medications before your next appointment, please call your pharmacy.

## 2017-09-26 IMAGING — CR DG CHEST 2V
2 series · 2 of 2 positions shown · non-contrast
Comparison: None.

CLINICAL DATA: Preop aortic valve replacement

EXAM:
CHEST  2 VIEW

[w chest pa]
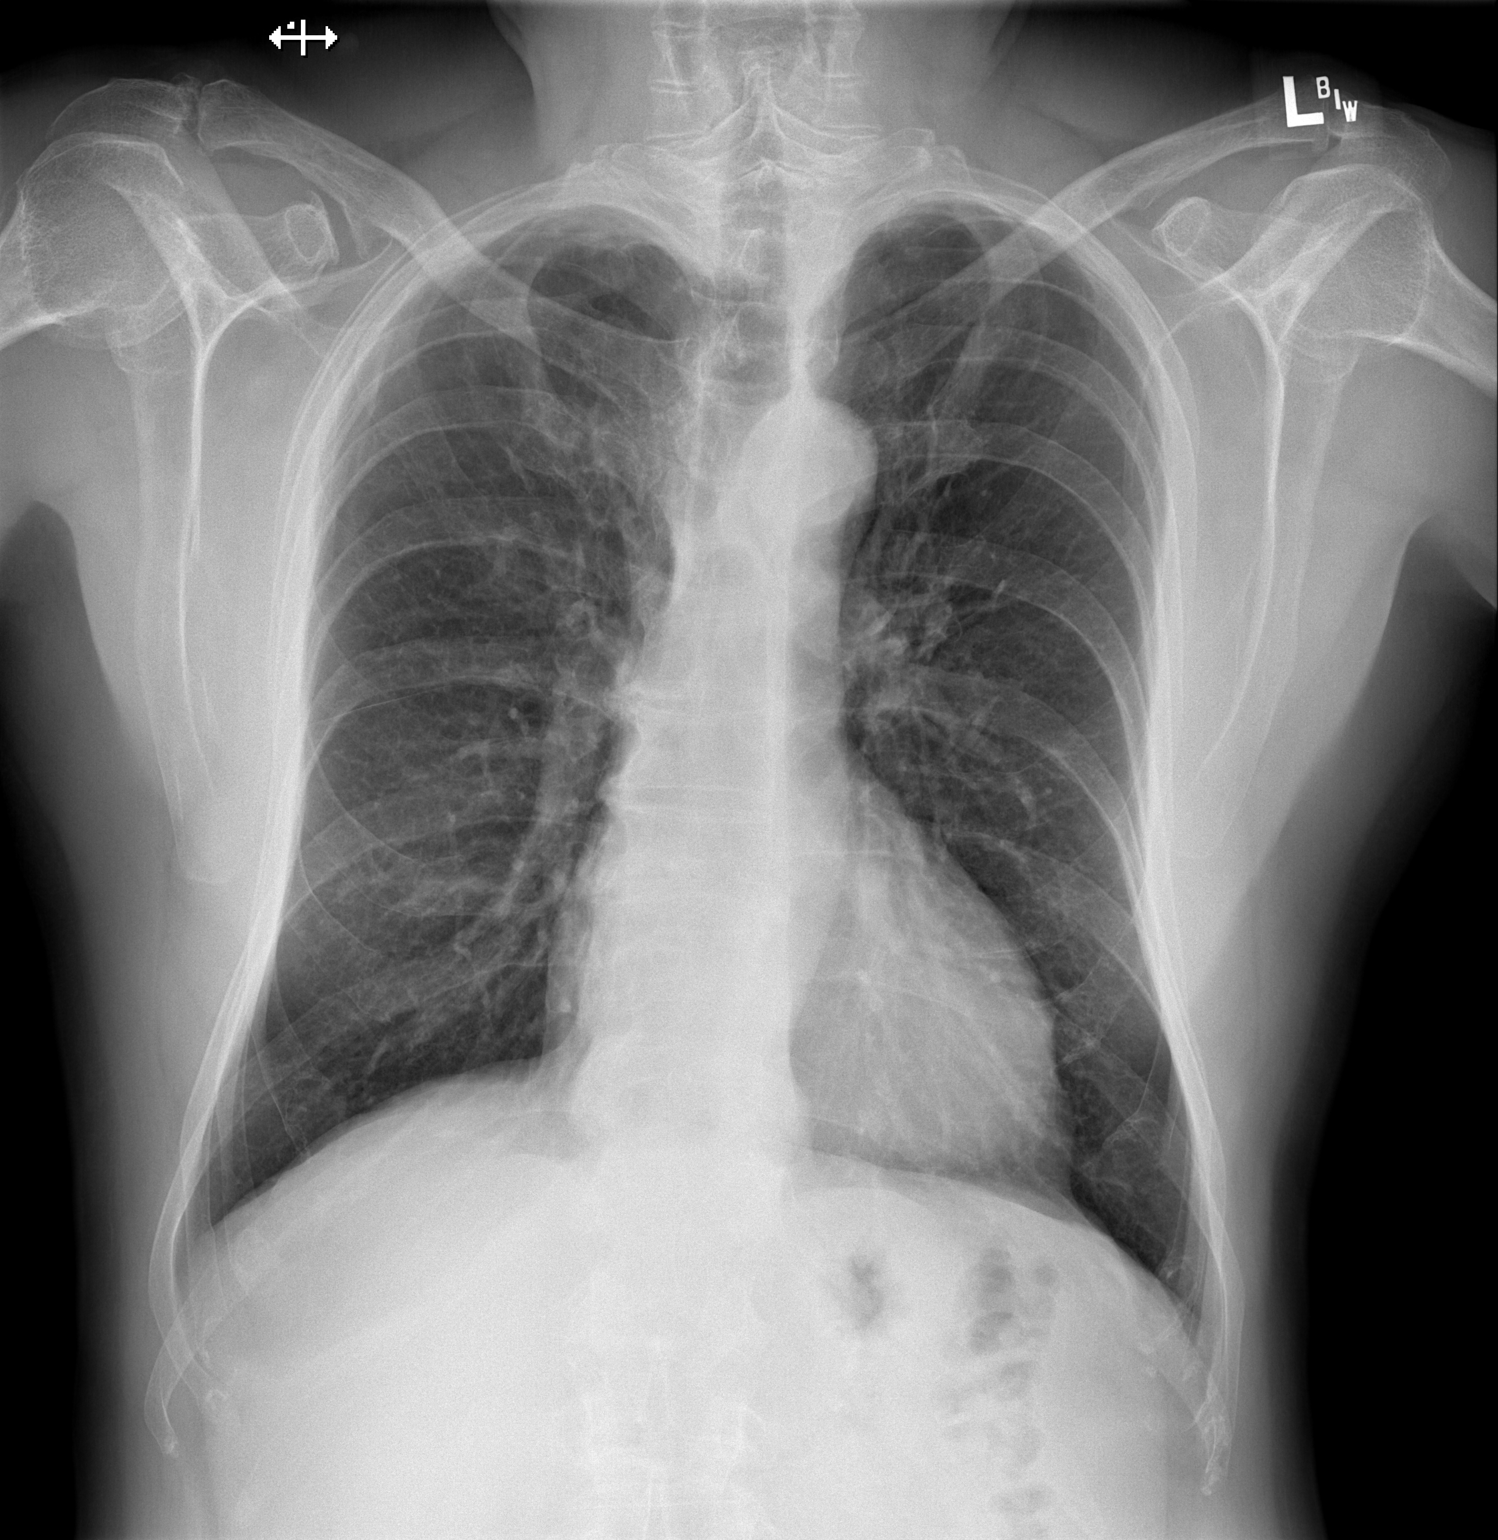

[w chest lat]
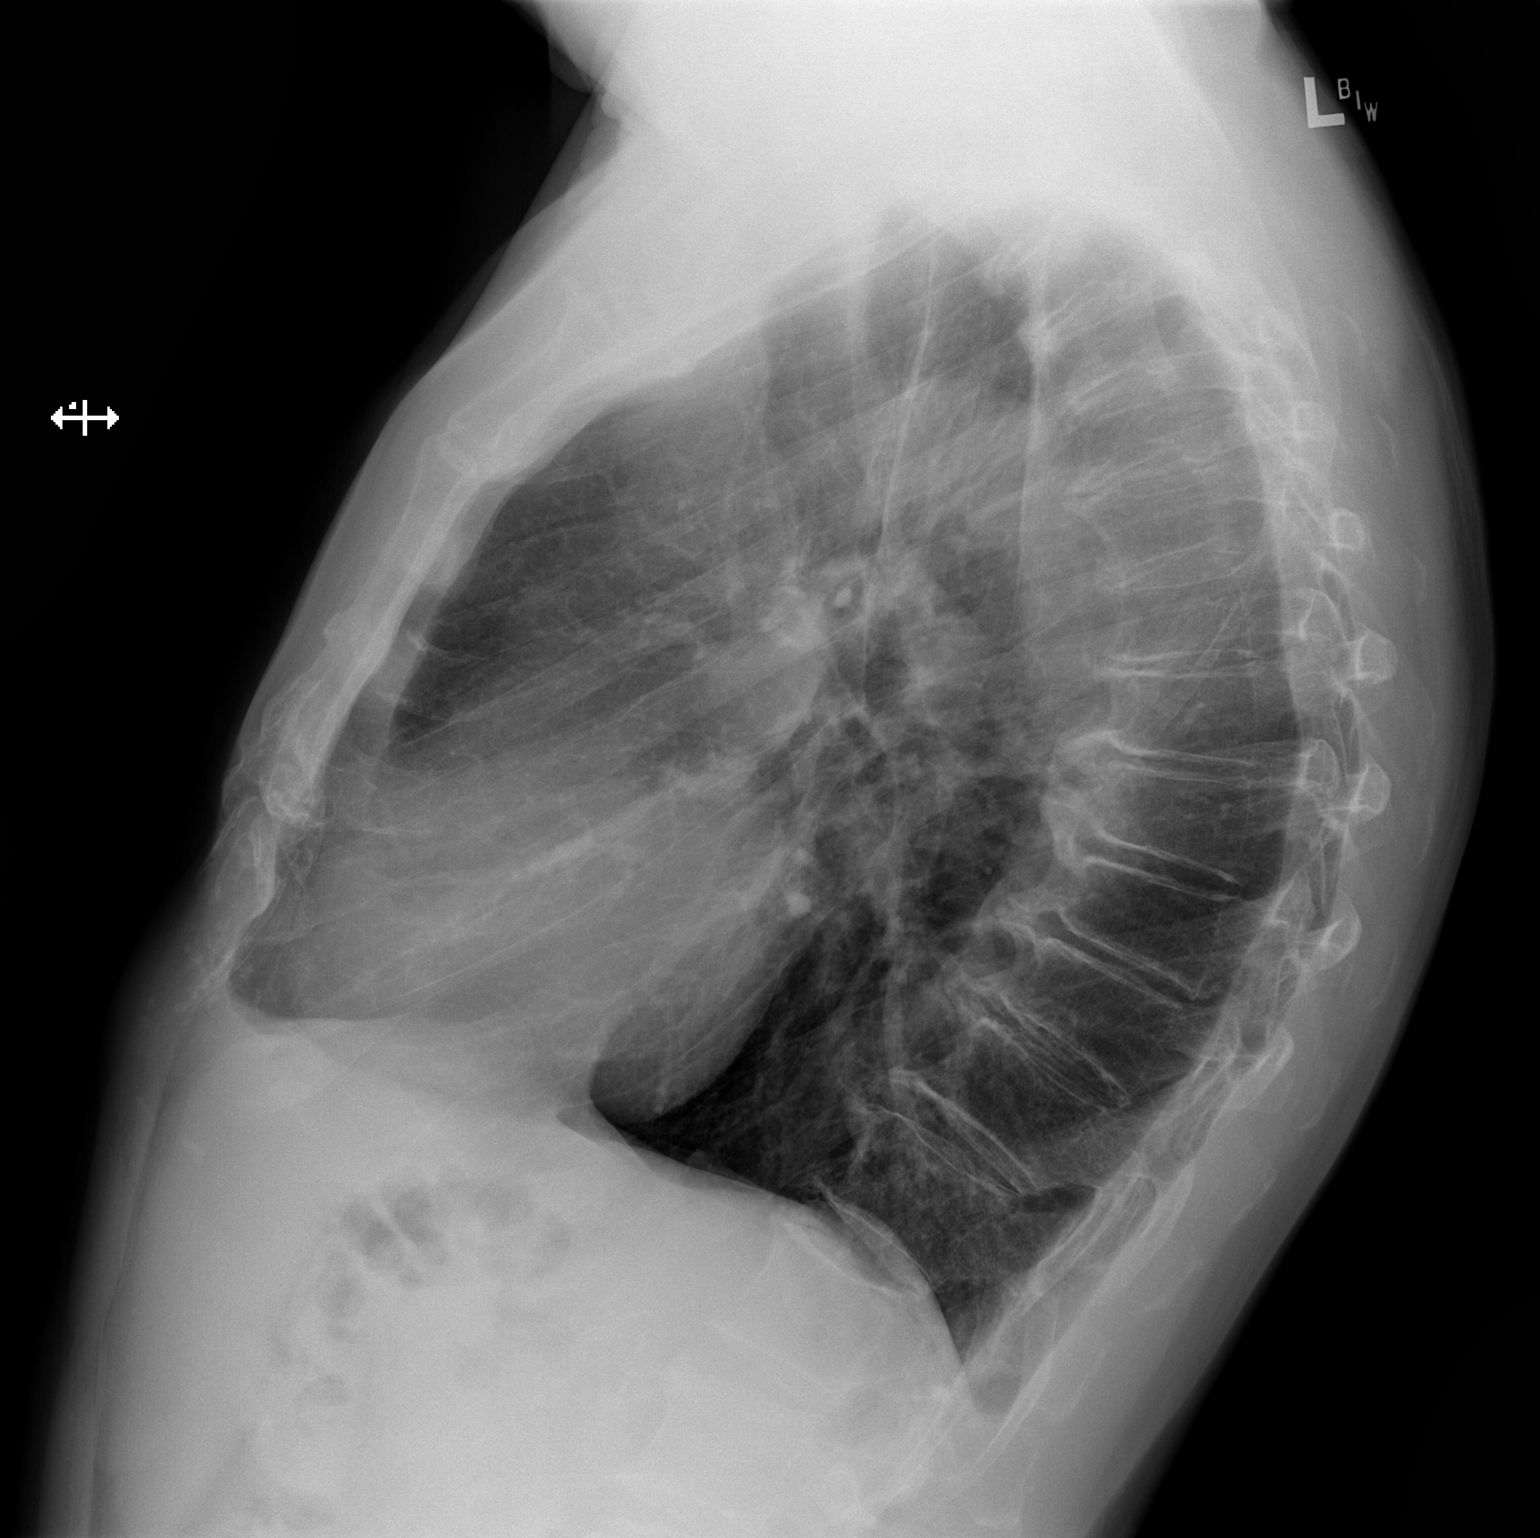

[2 of 2 positions shown; findings below may reference images not displayed]

FINDINGS: There is no focal parenchymal opacity. There is no pleural effusion
or pneumothorax. The heart and mediastinal contours are
unremarkable.

There is mild thoracic spine spondylosis.
IMPRESSION: No active cardiopulmonary disease.

## 2017-09-29 IMAGING — CR DG CHEST 1V PORT
1 series · 1 of 1 positions shown · non-contrast
Comparison: Yesterday

CLINICAL DATA: Sore chest.  Aortic valve replacement.

EXAM:
PORTABLE CHEST 1 VIEW

[AP]
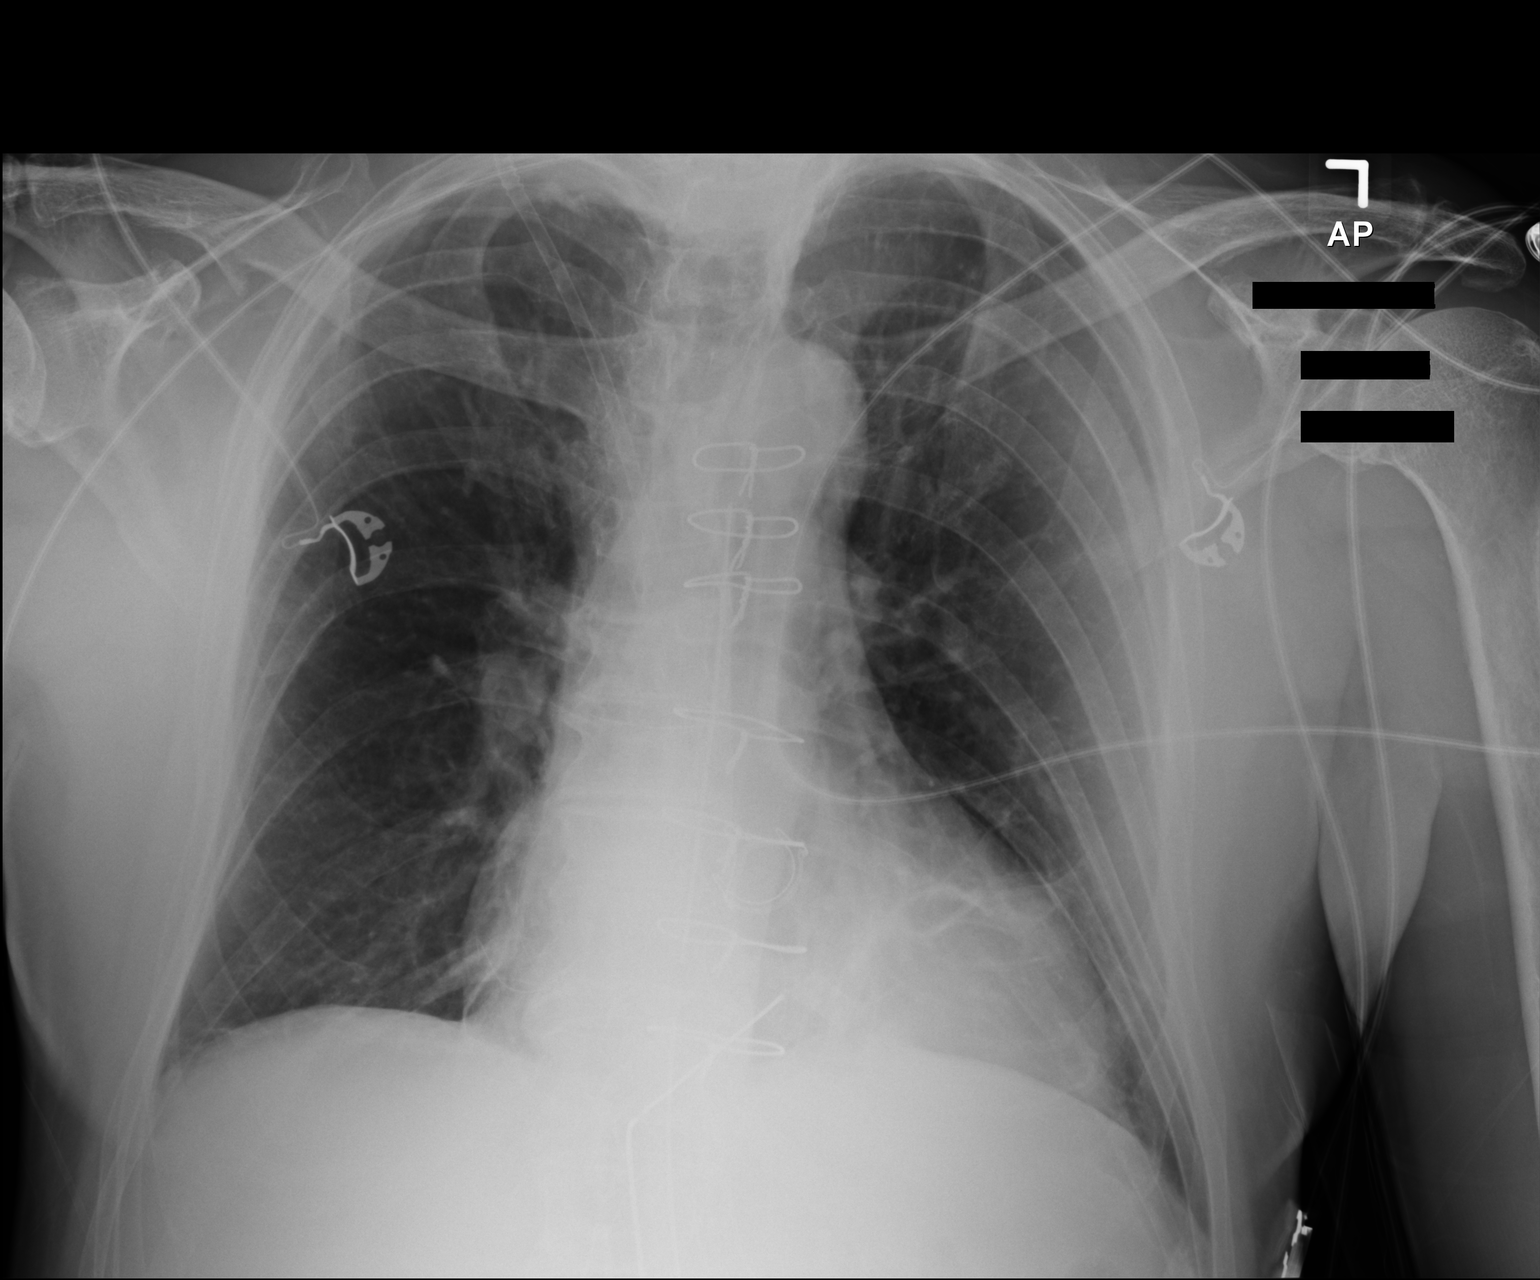

[1 of 1 positions shown; findings below may reference images not displayed]

FINDINGS: Stable heart size after aortic valve replacement. Mediastinal drains
and right IJ sheath remain. Improving aeration with mild residual
retrocardiac atelectasis. No effusion or pneumothorax.
IMPRESSION: Improving inflation after extubation.  No visible pneumothorax.

## 2017-10-01 IMAGING — CR DG CHEST 1V PORT
1 series · 1 of 1 positions shown · non-contrast
Comparison: August 31, 2016

CLINICAL DATA: Status post aortic valve replacement

EXAM:
PORTABLE CHEST 1 VIEW

[AP]
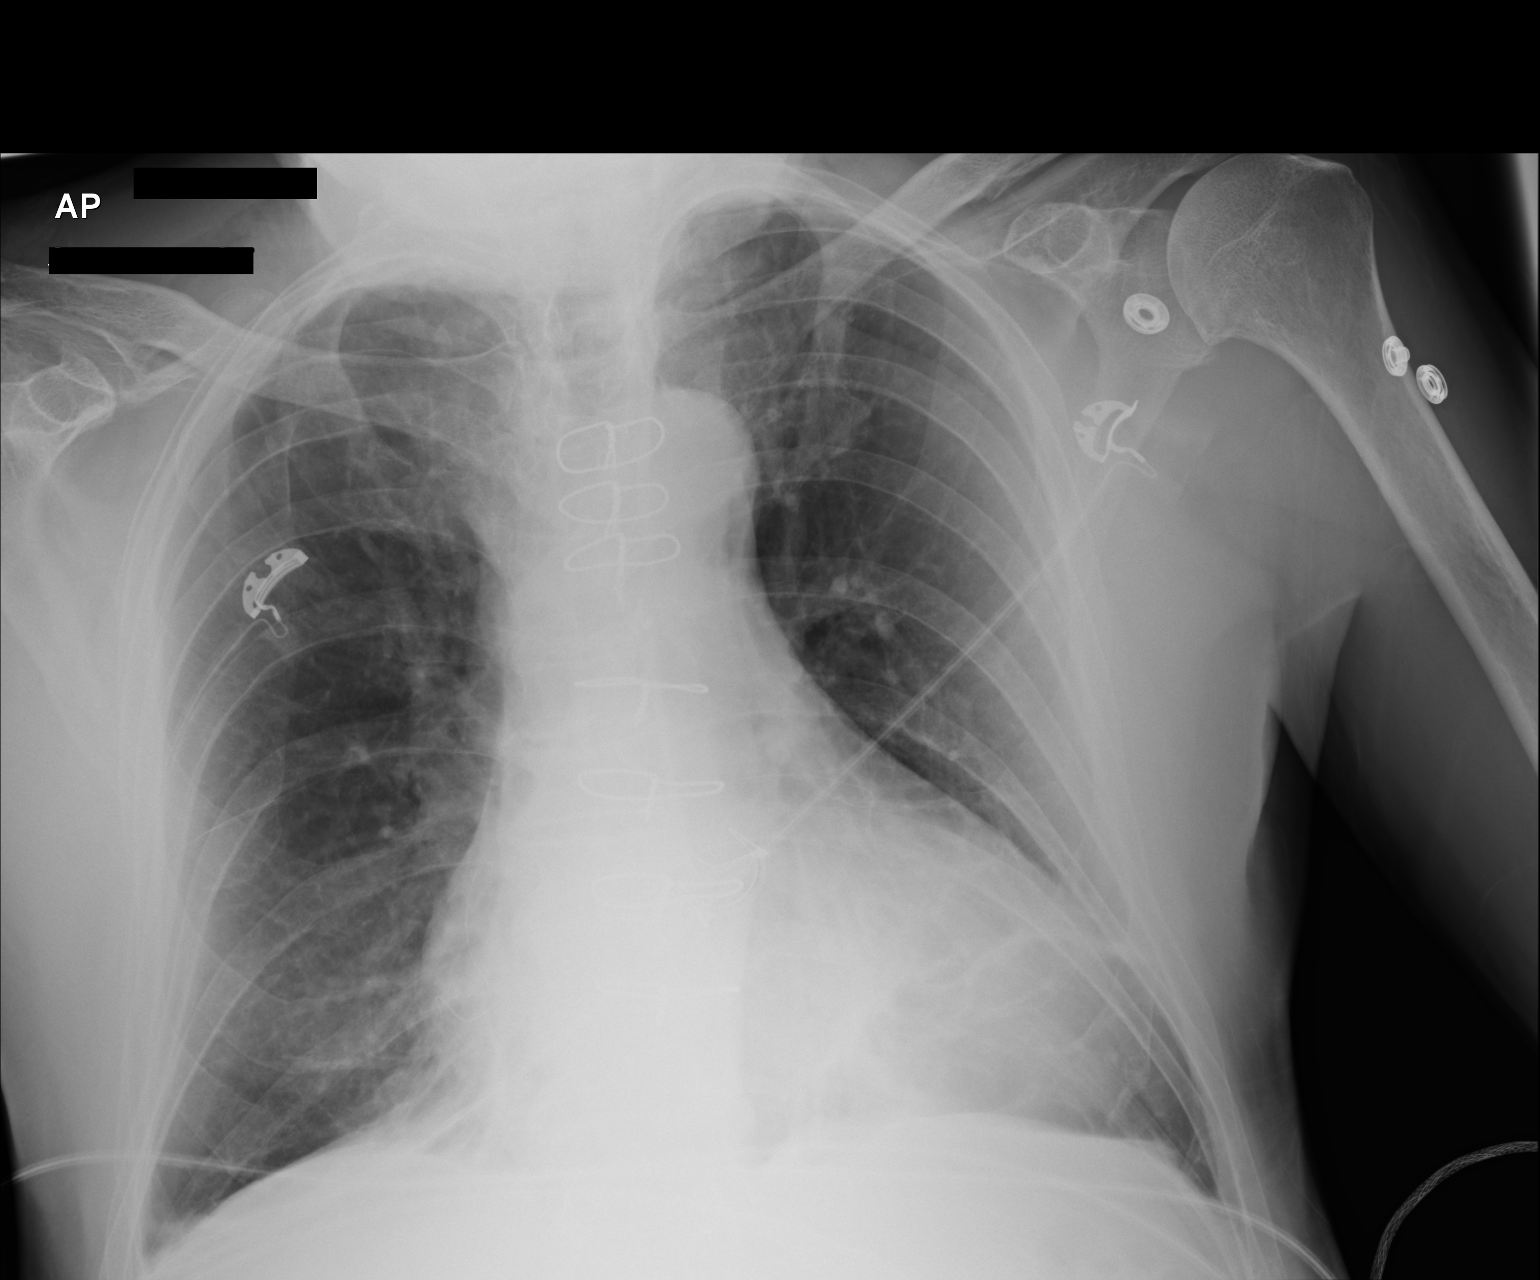

[1 of 1 positions shown; findings below may reference images not displayed]

FINDINGS: Stable cardiomegaly. No pneumothorax. The heart, hila, mediastinum,
lungs, and pleura are otherwise unchanged. Probable atelectasis in
the left retrocardiac region.
IMPRESSION: No active disease.

## 2017-10-03 IMAGING — DX DG CHEST 2V
2 series · 2 of 2 positions shown · non-contrast
Comparison: September 01, 2016

CLINICAL DATA: Status post aortic valve replacement with chest
pain.

EXAM:
CHEST  2 VIEW

[chest pa]
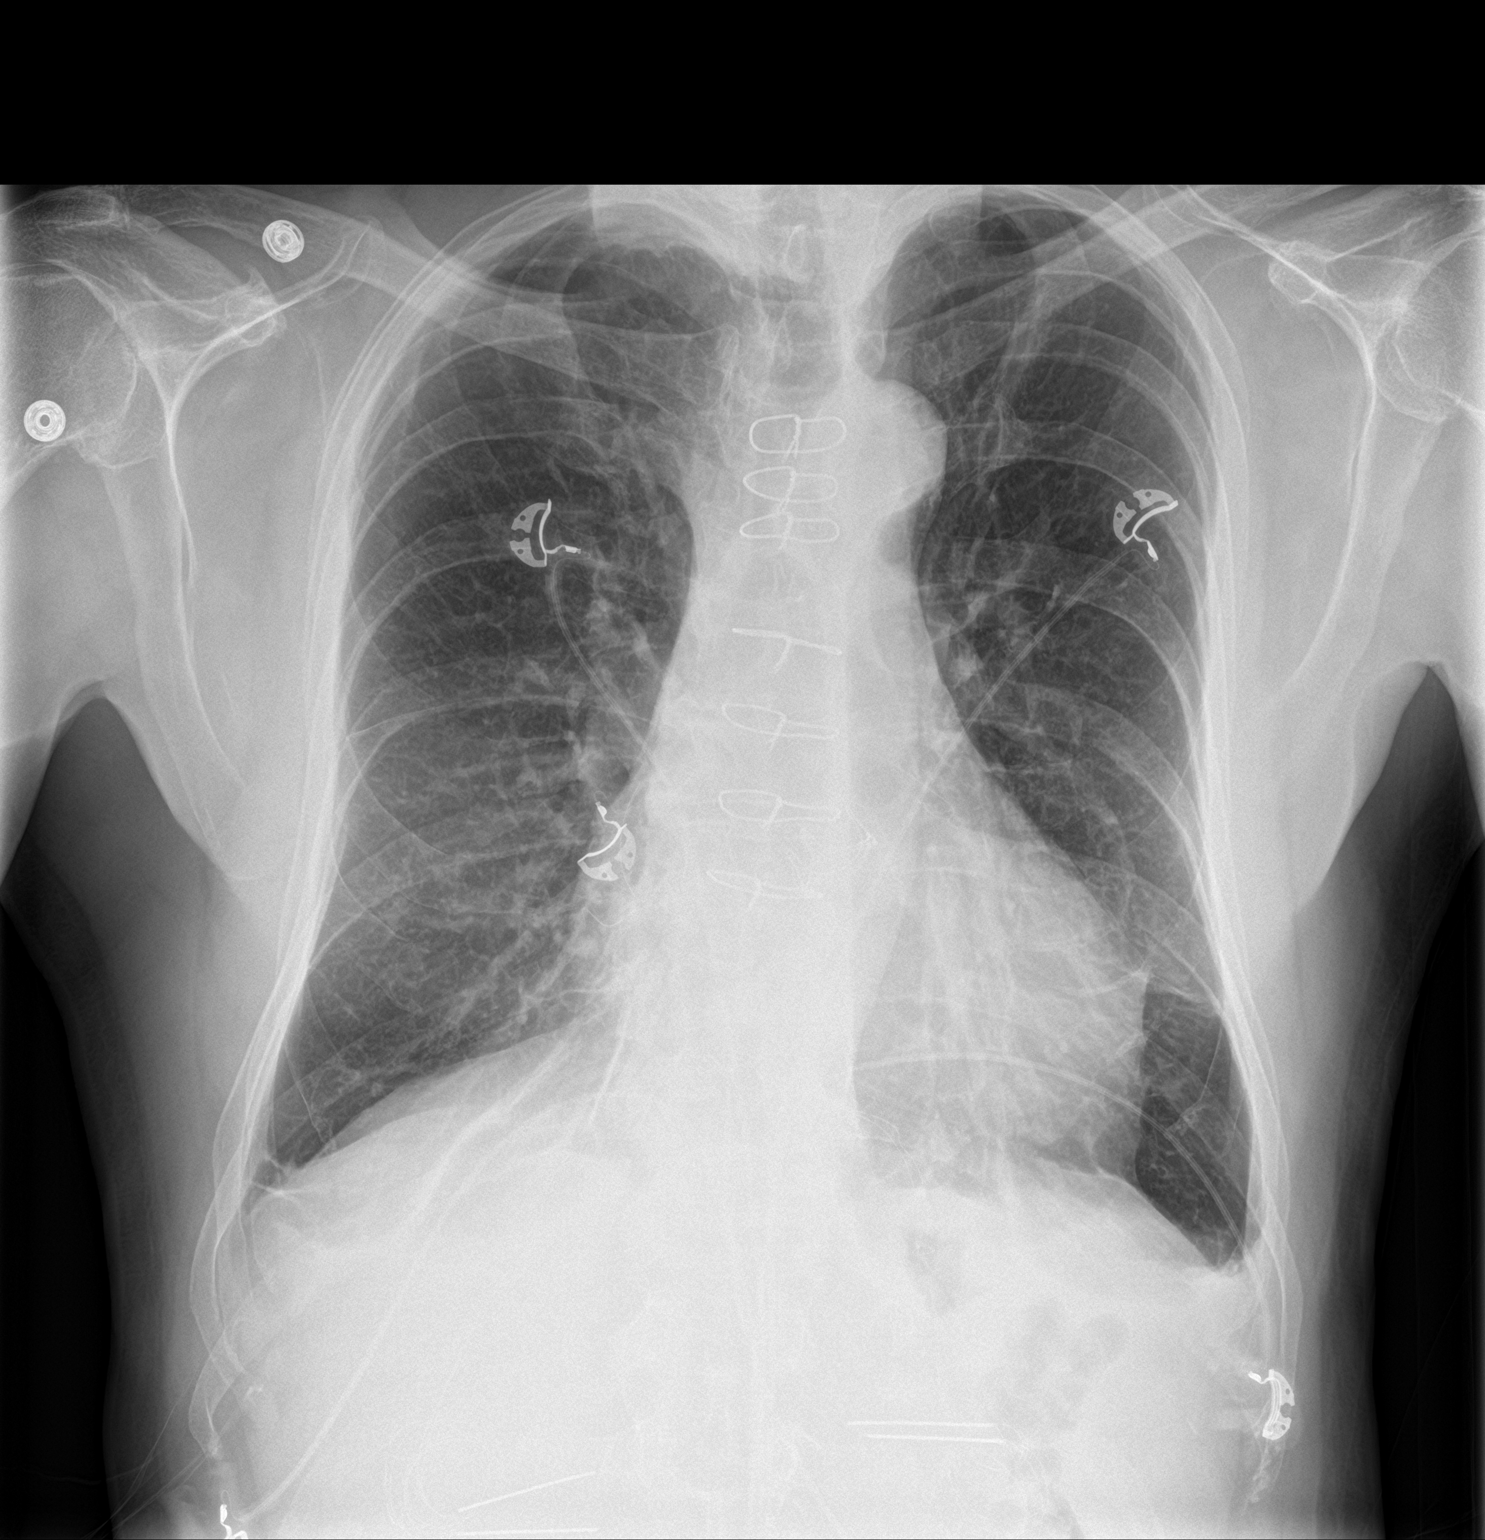

[chest lat]
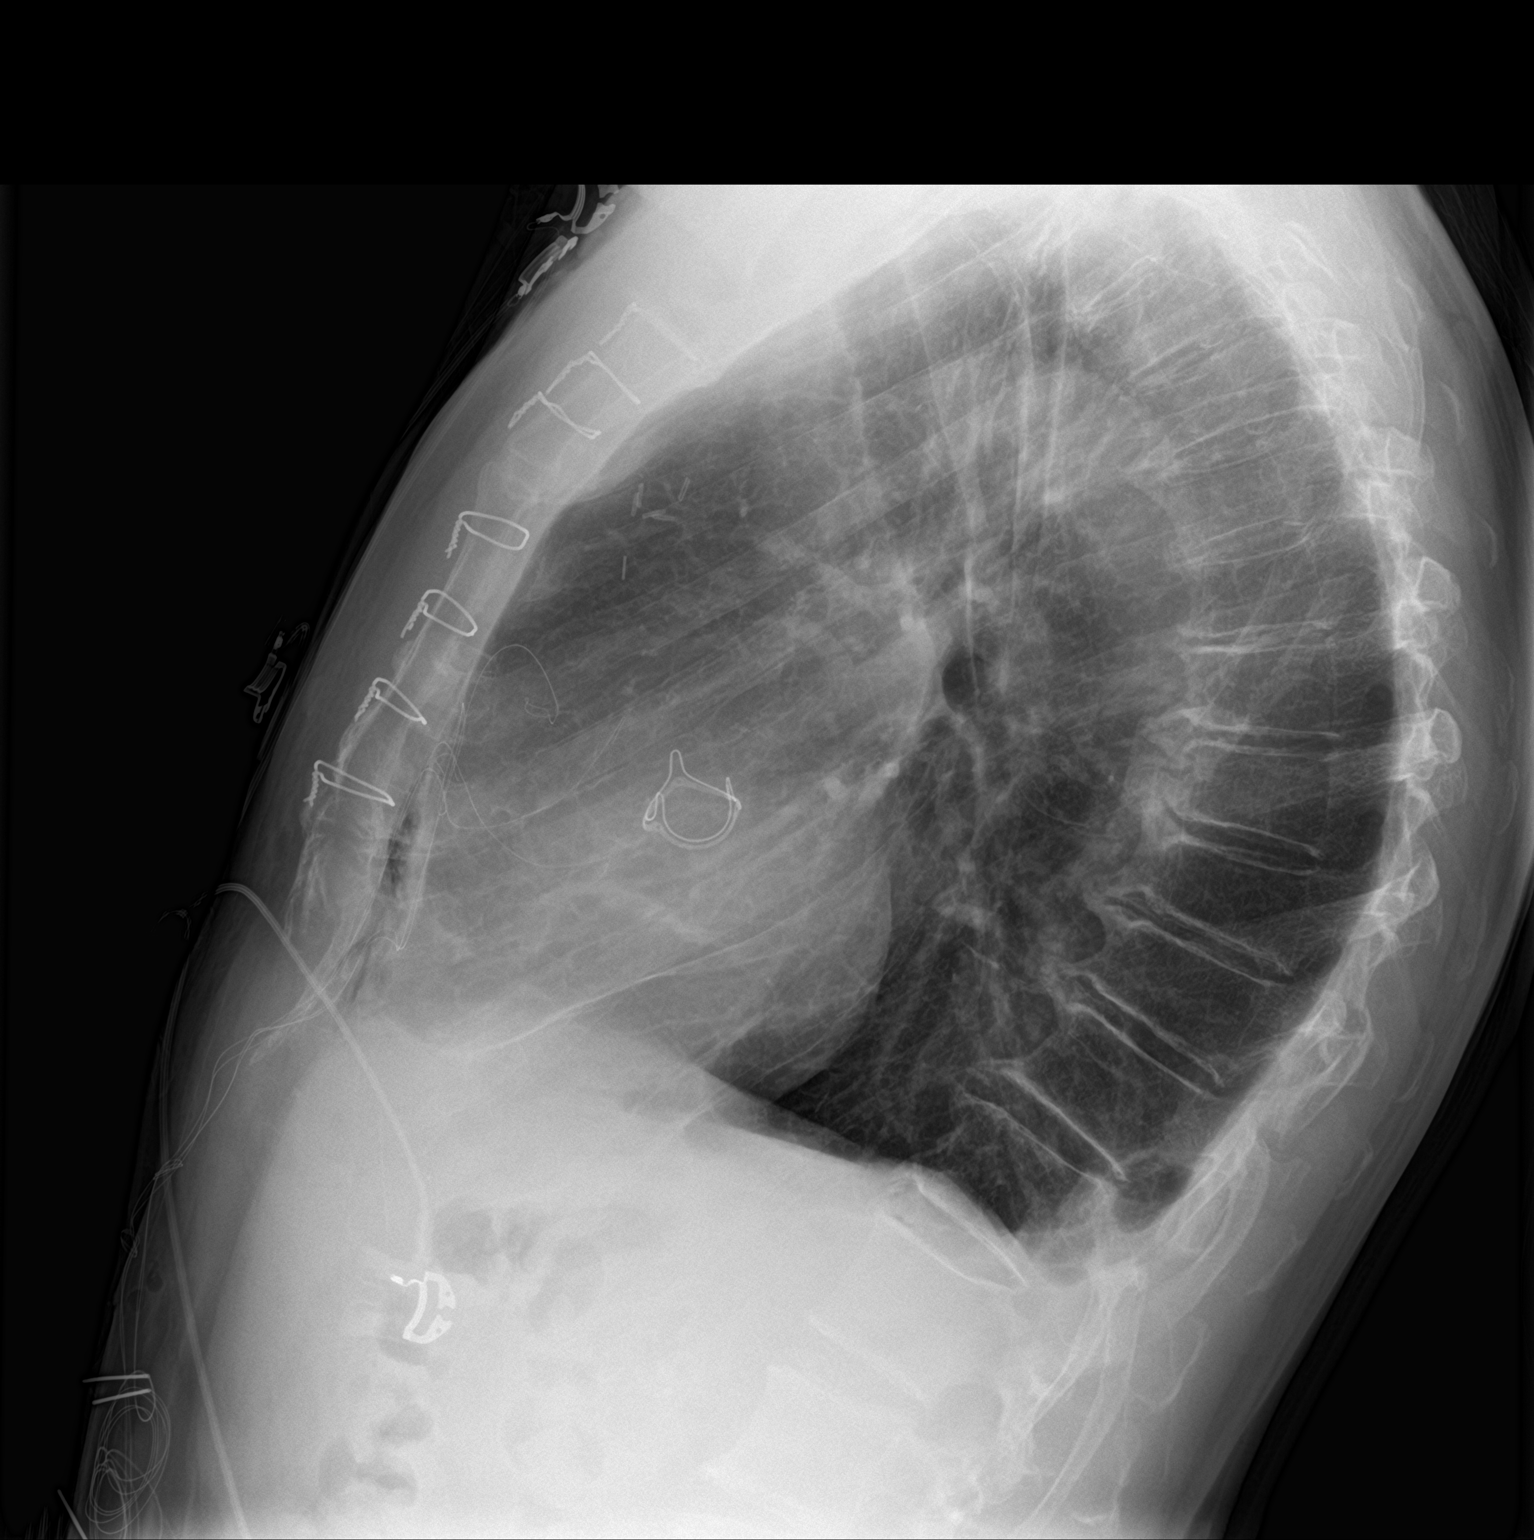

[2 of 2 positions shown; findings below may reference images not displayed]

FINDINGS: There is patchy atelectasis in each lung base. There are small
pleural effusions bilaterally. The lungs elsewhere are clear. Heart
is mildly enlarged with pulmonary vascularity within normal limits.
There is atherosclerotic calcification in the aorta. There are
temporary pacemaker wires attached to the right heart. Patient is
status post aortic valve replacement. There are also surgical clips
in the anterior mediastinum. There is no apparent pneumothorax.
There is degenerative change in the thoracic spine.
IMPRESSION: Bibasilar atelectasis with small pleural effusions bilaterally. No
frank edema or consolidation. Stable cardiac prominence. Temporary
pacemaker wires are attached the right heart. No pneumothorax.
Patient is status post aortic valve replacement. There is aortic
atherosclerosis.

## 2017-10-21 ENCOUNTER — Other Ambulatory Visit: Payer: Self-pay | Admitting: Interventional Cardiology

## 2018-02-17 ENCOUNTER — Other Ambulatory Visit: Payer: Self-pay | Admitting: Interventional Cardiology

## 2018-03-20 ENCOUNTER — Other Ambulatory Visit: Payer: Self-pay | Admitting: Interventional Cardiology

## 2018-08-12 ENCOUNTER — Other Ambulatory Visit: Payer: Self-pay | Admitting: Interventional Cardiology

## 2018-09-06 ENCOUNTER — Other Ambulatory Visit: Payer: Self-pay | Admitting: Interventional Cardiology

## 2018-10-02 ENCOUNTER — Other Ambulatory Visit: Payer: Self-pay | Admitting: Interventional Cardiology

## 2018-10-29 ENCOUNTER — Ambulatory Visit: Payer: BLUE CROSS/BLUE SHIELD | Admitting: Interventional Cardiology

## 2018-11-05 ENCOUNTER — Other Ambulatory Visit: Payer: Self-pay | Admitting: Interventional Cardiology

## 2018-11-20 ENCOUNTER — Telehealth: Payer: Self-pay | Admitting: *Deleted

## 2018-11-20 NOTE — Telephone Encounter (Signed)
   Primary Cardiologist: Dr Irish Lack  Chart reviewed and patient interviewed over the phone today as part of pre-operative protocol coverage. Based on ACC/AHA guidelines, Tawni Pummel, MD would be at acceptable risk for the planned procedure without further cardiovascular testing.   I discussed SBE prophylaxis with our pharmacist and since this doesn't appear to be a high risk procedure SBE prophylaxis not recomended.   I will route this recommendation to the requesting party via Epic fax function and remove from pre-op pool.  Please call with questions.  Kerin Ransom, PA-C 11/20/2018, 3:32 PM

## 2018-11-20 NOTE — Telephone Encounter (Signed)
   [facility] Pre-operative Risk Assessment    Request for surgical clearance:  1. What type of surgery is being performed? LEFT QUAD TENDON REPAIR   2. When is this surgery scheduled? 11/23/18   3. What type of clearance is required (medical clearance vs. Pharmacy clearance to hold med vs. Both)? MEDICAL  4. Are there any medications that need to be held prior to surgery and how long? SURGEON, DR. Veverly Fells SAYS OK TO CONTINUE ASA   5. Practice name and name of physician performing surgery? [facility]; DR. Veverly Fells   6. What is your office phone number [phone]    7.   What is your office fax number [phone]  8.   Anesthesia type (None, local, MAC, general) ? CHOICE   Ronald Gomez 11/20/2018, 11:38 AM  _________________________________________________________________   (provider comments below)

## 2018-12-01 ENCOUNTER — Other Ambulatory Visit: Payer: Self-pay | Admitting: Interventional Cardiology

## 2018-12-26 ENCOUNTER — Other Ambulatory Visit: Payer: Self-pay | Admitting: Interventional Cardiology

## 2019-01-05 NOTE — Progress Notes (Signed)
Cardiology Office Note   Date:  01/07/2019   ID:  Ronald Pummel, Ronald Gomez, DOB 1951/08/09, MRN [id number]  PCP:  [name], Ronald Gomez    No chief complaint on file.  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 Cyndia Bent. Of note, prior to AVR, he had a LHC which showed no CAD.  His AVR was on 08/29/16. He received a 23 mm Edwards Magna-Ease pericardialvalve. His post surgical course was complicated by atrial fibrillation. However, he converted andwas in NSR at time of discharge on 09/03/16. He called the office on 09/07/16, stating that he felt that he was back in atrial fibrillation. He was instructed to take an extra metoprolol and he successfully converted back to NSR. He had a second recurrence of an irregular heart rate, several days later, however it only lasted ~15 min.  He had some leg swelling in the past. He thinks it was related to salt intake. He took Lasix for a few days and ankles came back to normal.  In July 2018, he noticed some decreased exercise tolerance.  He decreased his alcohol intake. He tried Lasix without relief. He had an echo and the valve was ok.  He was having some muscle pain.  He stopped his statin, and feels that over time, his exercise tolerance increased.    In Dec 2019, he fell on the stairs while carrying Christmas decorations.  He tore his left quadriceps tendon and had surgery.  Denies : Chest pain. Dizziness. Leg edema. Nitroglycerin use. Orthopnea. Palpitations. Paroxysmal nocturnal dyspnea. Shortness of breath. Syncope.   Exercise has been limited since the  injury.    Uses SBE prophylaxis.     Past Medical History:  Diagnosis Date  . Anxiety   . Aortic stenosis   . Dyspnea    with exertion  . Hypertension   . Insomnia     Past Surgical History:  Procedure Laterality Date  . AORTIC VALVE REPLACEMENT N/A 08/29/2016   Procedure: AORTIC VALVE REPLACEMENT (AVR);  Surgeon: Gaye Pollack, Ronald Gomez;  Location: [facility];  Service: Open Heart Surgery;  Laterality: N/A;  . CARDIAC CATHETERIZATION N/A 12/25/2015   Procedure: Right/Left Heart Cath and Coronary Angiography;  Surgeon: Jettie Booze, Ronald Gomez;  Location: [facility];  Service: Cardiovascular;  Laterality: N/A;  . SUPRAVALVULAR AORTIC STENOSIS REPAIR N/A 2006   not done per patiebt  . TEE WITHOUT CARDIOVERSION N/A 08/29/2016   Procedure: TRANSESOPHAGEAL ECHOCARDIOGRAM (TEE);  Surgeon: Gaye Pollack, Ronald Gomez;  Location: [facility];  Service: Open Heart Surgery;  Laterality: N/A;  . TONSILLECTOMY       Current Outpatient Medications  Medication Sig Dispense Refill  . amLODipine (NORVASC) 10 MG tablet Take 1 tablet (10 mg total) by mouth daily. Please keep upcoming appt for future refills. 90 tablet 0  . aspirin 81 MG chewable tablet Chew 81 mg by mouth daily.    Marland Kitchen losartan (COZAAR) 25 MG tablet Take 1 tablet (25 mg total) by mouth daily. Please keep upcoming appt in February for future refills. Thank  you 90 tablet 0  . metoprolol succinate (TOPROL-XL) 50 MG 24 hr tablet TAKE 1 AND 1/2 TABLETS EVERY DAY 135 tablet 0  . Omega-3 Fatty Acids (FISH OIL) 1000 MG CAPS Take 1,000 mg by mouth daily. Stopping prior to procedure    . vitamin C (ASCORBIC ACID) 500 MG tablet Take 500 mg by mouth daily. Stopping prior to procedure    . furosemide (LASIX) 20 MG tablet TAKE 1 TABLET (20 MG TOTAL) BY MOUTH DAILY AS NEEDED. 30 tablet 8   No current facility-administered medications for this visit.     Allergies:   Lisinopril    Social History:  The patient  reports that he quit smoking about 18 years ago.  His smoking use included cigarettes. He quit after 20.00 years of use. He has never used smokeless tobacco. He reports current alcohol use of about 21.0 standard drinks of alcohol per week. He reports that he does not use drugs.   Family History:  The patient's family history includes Brain cancer in his father; CAD in his mother; Cardiomyopathy in his mother; Diabetes in his mother; Heart disease in his mother; Hypertension in his mother.    ROS:  Please see the history of present illness.   Otherwise, review of systems are positive for wearing left leg brace.   All other systems are reviewed and negative.    PHYSICAL EXAM: VS:  BP 120/79   Pulse 63   Ht 5\' 8"  (1.727 m)   Wt 169 lb 3.2 oz (76.7 kg)   SpO2 98%   BMI 25.73 kg/m  , BMI Body mass index is 25.73 kg/m. GEN: Well nourished, well developed, in no acute distress  HEENT: normal  Neck: no JVD, carotid bruits, or masses Cardiac: RRR; 2/6 systolic murmur, no rubs, or gallops,no edema  Respiratory:  clear to auscultation bilaterally, normal work of breathing GI: soft, nontender, nondistended, + BS MS: no deformity or atrophy  Skin: warm and dry, no rash Neuro:  Strength and sensation are intact Psych: euthymic mood, full affect   EKG:   The ekg ordered today demonstrates NSR, no ST changes   Recent Labs: No results found for requested labs within last 8760 hours.   Lipid Panel    Component Value Date/Time   CHOL 167 09/15/2017 1013   TRIG 94 09/15/2017 1013   HDL 39 (L) 09/15/2017 1013   CHOLHDL 4.3 09/15/2017 1013   CHOLHDL 2.5 01/31/2016 0749   VLDL 16 01/31/2016 0749   LDLCALC 109 (H) 09/15/2017 1013     Other studies Reviewed: Additional studies/ records that were reviewed today with results demonstrating: labs from Dr. Forde Dandy reviewed.   ASSESSMENT AND PLAN:  1. S/p AVR: Valve functioning well.  COntinue SBE prophylaxis.  2. PAF: In NSR. 3. Hyperlipidemia: LDL controlled at 90 in May 2019.   4. HTN:  The current medical regimen is effective;  continue present plan and medications. 5. Pilots license:  He notes that his memory and hearing are not what they used to be.  He is not pursuing this.  He has other activities.  He volunteers.     Current medicines are reviewed at length with the patient today.  The patient concerns regarding his medicines were addressed.  The following changes have been made:  No change  Labs/ tests ordered today include:  No orders of the defined types were placed in this encounter.   Recommend 150 minutes/week of aerobic exercise Low fat, low carb, high fiber  diet recommended  Disposition:   FU in 1 year   Signed, Larae Grooms, Ronald Gomez  01/07/2019 8:43 AM    [facility] [place], [place], [place]  [address] Phone: [phone]; Fax: [phone]

## 2019-01-07 ENCOUNTER — Encounter: Payer: Self-pay | Admitting: Interventional Cardiology

## 2019-01-07 ENCOUNTER — Ambulatory Visit (INDEPENDENT_AMBULATORY_CARE_PROVIDER_SITE_OTHER): Payer: Medicare Other | Admitting: Interventional Cardiology

## 2019-01-07 VITALS — BP 120/79 | HR 63 | Ht 68.0 in | Wt 169.2 lb

## 2019-01-07 DIAGNOSIS — I10 Essential (primary) hypertension: Secondary | ICD-10-CM | POA: Diagnosis not present

## 2019-01-07 DIAGNOSIS — I480 Paroxysmal atrial fibrillation: Secondary | ICD-10-CM | POA: Diagnosis not present

## 2019-01-07 DIAGNOSIS — E782 Mixed hyperlipidemia: Secondary | ICD-10-CM

## 2019-01-07 DIAGNOSIS — Z952 Presence of prosthetic heart valve: Secondary | ICD-10-CM | POA: Diagnosis not present

## 2019-01-07 MED ORDER — AMLODIPINE BESYLATE 10 MG PO TABS: 10.0000 mg | ORAL_TABLET | Freq: Every day | ORAL | 3 refills | Status: DC

## 2019-01-07 MED ORDER — METOPROLOL SUCCINATE ER 50 MG PO TB24: ORAL_TABLET | ORAL | 3 refills | Status: DC

## 2019-01-07 NOTE — Patient Instructions (Signed)

## 2019-09-02 LAB — IFOBT (OCCULT BLOOD): IFOBT: POSITIVE

## 2019-09-09 ENCOUNTER — Encounter: Payer: Self-pay | Admitting: Gastroenterology

## 2019-09-16 ENCOUNTER — Ambulatory Visit (INDEPENDENT_AMBULATORY_CARE_PROVIDER_SITE_OTHER): Payer: Medicare Other | Admitting: Gastroenterology

## 2019-09-16 ENCOUNTER — Encounter: Payer: Self-pay | Admitting: Gastroenterology

## 2019-09-16 ENCOUNTER — Encounter

## 2019-09-16 ENCOUNTER — Other Ambulatory Visit: Payer: Self-pay

## 2019-09-16 VITALS — BP 128/60 | HR 73 | Temp 98.0°F | Ht 69.0 in | Wt 164.6 lb

## 2019-09-16 DIAGNOSIS — Z1159 Encounter for screening for other viral diseases: Secondary | ICD-10-CM | POA: Insufficient documentation

## 2019-09-16 DIAGNOSIS — R195 Other fecal abnormalities: Secondary | ICD-10-CM

## 2019-09-16 MED ORDER — NA SULFATE-K SULFATE-MG SULF 17.5-3.13-1.6 GM/177ML PO SOLN: ORAL | 0 refills | Status: DC

## 2019-09-16 NOTE — Patient Instructions (Signed)
If you are age 66 or older, your body mass index should be between 23-30. Your Body mass index is 24.31 kg/m. If this is out of the aforementioned range listed, please consider follow up with your Primary Care Provider.  If you are age 54 or younger, your body mass index should be between 19-25. Your Body mass index is 24.31 kg/m. If this is out of the aformentioned range listed, please consider follow up with your Primary Care Provider.   You have been scheduled for an endoscopy and colonoscopy. Please follow the written instructions given to you at your visit today. Please pick up your prep supplies at the pharmacy within the next 1-3 days. If you use inhalers (even only as needed), please bring them with you on the day of your procedure. Your physician has requested that you go to www.startemmi.com and enter the access code given to you at your visit today. This web site gives a general overview about your procedure. However, you should still follow specific instructions given to you by our office regarding your preparation for the procedure.  We have sent the following medications to your pharmacy for you to pick up at your convenience: Suprep  Thank you for choosing me and [facility].   Alonza Bogus, PA-C

## 2019-09-16 NOTE — Progress Notes (Signed)
09/16/2019 Ronald Pummel, MD [id number] 08/18/1951   HISTORY OF PRESENT ILLNESS:  This is a pleasant 68 year old male who is a patient of Dr. Ardis Hughs.  He presents here today at the request of his PCP, Dr. Forde Dandy, for evaluation regarding abnormal contents in the stool.  He saw Dr. Forde Dandy for his routine physical exam and had a FIT stool test performed.  This came back positive.  Then Dr. Forde Dandy had him perform 3 Hemoccult cards, which were also all found to be positive.  His hemoglobin is normal at 15.3 g.  Remaining lab studies look normal as well.  His last colonoscopy was by Dr. Ardis Hughs in February 2016 at which time he was found to have one polyp that was removed, diverticulosis, external hemorrhoids.  Polyp was a tubular adenoma with repeat colonoscopy recommended in 5 years.  He denies any significant GI issues.  He says that occasionally depending on what he eats he notices more gas and bloating sensation, but no major changes in his bowel habits.  He denies seeing any red blood in his stools and no black stools.  He is very anxious about these abnormal findings.    Past Medical History:  Diagnosis Date  . Anxiety   . Aortic stenosis   . Dyspnea    with exertion  . Hypertension   . Insomnia    Past Surgical History:  Procedure Laterality Date  . AORTIC VALVE REPLACEMENT N/A 08/29/2016   Procedure: AORTIC VALVE REPLACEMENT (AVR);  Surgeon: Gaye Pollack, MD;  Location: [facility];  Service: Open Heart Surgery;  Laterality: N/A;  . CARDIAC CATHETERIZATION N/A 12/25/2015   Procedure: Right/Left Heart Cath and Coronary Angiography;  Surgeon: Jettie Booze, MD;  Location: [facility];  Service: Cardiovascular;  Laterality: N/A;  . SUPRAVALVULAR AORTIC STENOSIS REPAIR N/A 2006   not done per patiebt  . TEE WITHOUT CARDIOVERSION N/A 08/29/2016   Procedure: TRANSESOPHAGEAL ECHOCARDIOGRAM (TEE);  Surgeon: Gaye Pollack, MD;  Location: [facility];  Service: Open Heart Surgery;   Laterality: N/A;  . TONSILLECTOMY      reports that he quit smoking about 18 years ago. His smoking use included cigarettes. He quit after 20.00 years of use. He has never used smokeless tobacco. He reports current alcohol use of about 21.0 standard drinks of alcohol per week. He reports that he does not use drugs. family history includes Brain cancer in his father; CAD in his mother; Cardiomyopathy in his mother; Diabetes in his mother; Heart disease in his mother; Hypertension in his mother. Allergies  Allergen Reactions  . Lisinopril Rash and Other (See Comments)    TINGLING OF LIPS      Outpatient Encounter Medications as of 09/16/2019  Medication Sig  . amLODipine (NORVASC) 10 MG tablet Take 1 tablet (10 mg total) by mouth daily.  Marland Kitchen aspirin 81 MG chewable tablet Chew 81 mg by mouth daily.  Marland Kitchen DOXYLAMINE SUCCINATE PO Take by mouth.  . furosemide (LASIX) 20 MG tablet Take 20 mg by mouth daily.  Marland Kitchen losartan (COZAAR) 25 MG tablet Take 1 tablet (25 mg total) by mouth daily. Please keep upcoming appt in February for future refills. Thank you  . metoprolol succinate (TOPROL-XL) 50 MG 24 hr tablet TAKE 1 AND 1/2 TABLETS EVERY DAY  . [DISCONTINUED] furosemide (LASIX) 20 MG tablet TAKE 1 TABLET (20 MG TOTAL) BY MOUTH DAILY AS NEEDED.  . [DISCONTINUED] Omega-3 Fatty Acids (FISH OIL) 1000 MG CAPS Take 1,000  mg by mouth daily. Stopping prior to procedure  . [DISCONTINUED] vitamin C (ASCORBIC ACID) 500 MG tablet Take 500 mg by mouth daily. Stopping prior to procedure   No facility-administered encounter medications on file as of 09/16/2019.      REVIEW OF SYSTEMS  : All other systems reviewed and negative except where noted in the History of Present Illness.   PHYSICAL EXAM: BP 128/60   Pulse 73   Temp 98 F (36.7 C)   Ht 5\' 9"  (1.753 m)   Wt 164 lb 9.6 oz (74.7 kg)   SpO2 98%   BMI 24.31 kg/m  General: Well developed white male in no acute distress Head: Normocephalic and atraumatic  Eyes:  Sclerae anicteric, conjunctiva pink. Ears: Normal auditory acuity Lungs: Clear throughout to auscultation; no increased WOB. Heart: Regular rate and rhythm; no M/R/G. Abdomen: Soft, non-distended.  BS present.  Non-tender. Rectal:  Will be done at the time of colonoscopy. Musculoskeletal: Symmetrical with no gross deformities  Skin: No lesions on visible extremities Extremities: No edema  Neurological: Alert oriented x 4, grossly non-focal Psychological:  Alert and cooperative. Normal mood and affect  ASSESSMENT AND PLAN: *68 year old male with positive FIT test and positive Hemoccult cards x 3.  Had colonoscopy in February 2016 with 1 adenomatous polyp removed.  Never had endoscopy.  Hemoglobin is normal.  We will plan for both EGD and colonoscopy with Dr. Ardis Hughs.  Patient is extremely anxious about these abnormal studies.  **The risks, benefits, and alternatives to EGD and colonoscopy were discussed with the patient and he consents to proceed.   CC:  [name], MD

## 2019-09-17 NOTE — Progress Notes (Signed)
I agree with the above note, plan.  He is being overscreened for colon cancer with annual stool tests.

## 2019-09-30 ENCOUNTER — Other Ambulatory Visit: Payer: Self-pay | Admitting: Gastroenterology

## 2019-09-30 ENCOUNTER — Ambulatory Visit (INDEPENDENT_AMBULATORY_CARE_PROVIDER_SITE_OTHER): Payer: BLUE CROSS/BLUE SHIELD

## 2019-09-30 DIAGNOSIS — Z1159 Encounter for screening for other viral diseases: Secondary | ICD-10-CM

## 2019-09-30 LAB — SARS CORONAVIRUS 2 (TAT 6-24 HRS): SARS Coronavirus 2: NEGATIVE

## 2019-10-04 ENCOUNTER — Ambulatory Visit (AMBULATORY_SURGERY_CENTER): Payer: Medicare Other | Admitting: Gastroenterology

## 2019-10-04 ENCOUNTER — Encounter: Payer: Self-pay | Admitting: Gastroenterology

## 2019-10-04 ENCOUNTER — Other Ambulatory Visit: Payer: Self-pay

## 2019-10-04 VITALS — BP 116/76 | HR 58 | Temp 97.8°F | Resp 15 | Ht 69.0 in | Wt 164.0 lb

## 2019-10-04 DIAGNOSIS — K449 Diaphragmatic hernia without obstruction or gangrene: Secondary | ICD-10-CM | POA: Diagnosis not present

## 2019-10-04 DIAGNOSIS — K2950 Unspecified chronic gastritis without bleeding: Secondary | ICD-10-CM

## 2019-10-04 DIAGNOSIS — R195 Other fecal abnormalities: Secondary | ICD-10-CM | POA: Diagnosis present

## 2019-10-04 DIAGNOSIS — K6389 Other specified diseases of intestine: Secondary | ICD-10-CM

## 2019-10-04 DIAGNOSIS — K5730 Diverticulosis of large intestine without perforation or abscess without bleeding: Secondary | ICD-10-CM | POA: Diagnosis not present

## 2019-10-04 DIAGNOSIS — K635 Polyp of colon: Secondary | ICD-10-CM | POA: Diagnosis not present

## 2019-10-04 DIAGNOSIS — D124 Benign neoplasm of descending colon: Secondary | ICD-10-CM

## 2019-10-04 DIAGNOSIS — Z1159 Encounter for screening for other viral diseases: Secondary | ICD-10-CM

## 2019-10-04 MED ORDER — SODIUM CHLORIDE 0.9 % IV SOLN: 500.0000 mL | Freq: Once | INTRAVENOUS | Status: DC

## 2019-10-04 NOTE — Progress Notes (Signed)
Vitals-CW Temp-JB  History reviewed. 

## 2019-10-04 NOTE — Patient Instructions (Signed)
Thank you for letting us take care of your healthcare needs today.  Please see handouts given to you on Polyps, Diverticulosis and Hiatal Hernia.     YOU HAD AN ENDOSCOPIC PROCEDURE TODAY AT [facility]:   Refer to the procedure report that was given to you for any specific questions about what was found during the examination.  If the procedure report does not answer your questions, please call your gastroenterologist to clarify.  If you requested that your care partner not be given the details of your procedure findings, then the procedure report has been included in a sealed envelope for you to review at your convenience later.  YOU SHOULD EXPECT: Some feelings of bloating in the abdomen. Passage of more gas than usual.  Walking can help get rid of the air that was put into your GI tract during the procedure and reduce the bloating. If you had a lower endoscopy (such as a colonoscopy or flexible sigmoidoscopy) you may notice spotting of blood in your stool or on the toilet paper. If you underwent a bowel prep for your procedure, you may not have a normal bowel movement for a few days.  Please Note:  You might notice some irritation and congestion in your nose or some drainage.  This is from the oxygen used during your procedure.  There is no need for concern and it should clear up in a day or so.  SYMPTOMS TO REPORT IMMEDIATELY:   Following lower endoscopy (colonoscopy or flexible sigmoidoscopy):  Excessive amounts of blood in the stool  Significant tenderness or worsening of abdominal pains  Swelling of the abdomen that is new, acute  Fever of 100F or higher   Following upper endoscopy (EGD)  Vomiting of blood or coffee ground material  New chest pain or pain under the shoulder blades  Painful or persistently difficult swallowing  New shortness of breath  Fever of 100F or higher  Black, tarry-looking stools  For urgent or emergent issues, a gastroenterologist can  be reached at any hour by calling [phone].   DIET:  We do recommend a small meal at first, but then you may proceed to your regular diet.  Drink plenty of fluids but you should avoid alcoholic beverages for 24 hours.  ACTIVITY:  You should plan to take it easy for the rest of today and you should NOT DRIVE or use heavy machinery until tomorrow (because of the sedation medicines used during the test).    FOLLOW UP: Our staff will call the number listed on your records 48-72 hours following your procedure to check on you and address any questions or concerns that you may have regarding the information given to you following your procedure. If we do not reach you, we will leave a message.  We will attempt to reach you two times.  During this call, we will ask if you have developed any symptoms of COVID 19. If you develop any symptoms (ie: fever, flu-like symptoms, shortness of breath, cough etc.) before then, please call [phone].  If you test positive for Covid 19 in the 2 weeks post procedure, please call and report this information to [place].    If any biopsies were taken you will be contacted by phone or by letter within the next 1-3 weeks.  Please call us at [phone] if you have not heard about the biopsies in 3 weeks.    SIGNATURES/CONFIDENTIALITY: You and/or your care partner have signed paperwork which will  be entered into your electronic medical record.  These signatures attest to the fact that that the information above on your After Visit Summary has been reviewed and is understood.  Full responsibility of the confidentiality of this discharge information lies with you and/or your care-partner. 

## 2019-10-04 NOTE — Op Note (Signed)
[facility] Patient Name: Ronald Gomez Procedure Date: 10/04/2019 2:34 PM MRN: [id number] Endoscopist: Milus Banister , MD Age: 68 Referring MD:  Date of Birth: Dec 29, 1950 Gender: Male Account #: [id number] Procedure:                Colonoscopy Indications:              Heme positive stool, single subCM adenoma removed                            2016 Medicines:                Monitored Anesthesia Care Procedure:                Pre-Anesthesia Assessment:                           - Prior to the procedure, a History and Physical                            was performed, and patient medications and                            allergies were reviewed. The patient's tolerance of                            previous anesthesia was also reviewed. The risks                            and benefits of the procedure and the sedation                            options and risks were discussed with the patient.                            All questions were answered, and informed consent                            was obtained. Prior Anticoagulants: The patient has                            taken no previous anticoagulant or antiplatelet                            agents. ASA Grade Assessment: II - A patient with                            mild systemic disease. After reviewing the risks                            and benefits, the patient was deemed in                            satisfactory condition to undergo the procedure.  After obtaining informed consent, the colonoscope                            was passed under direct vision. Throughout the                            procedure, the patient's blood pressure, pulse, and                            oxygen saturations were monitored continuously. The                            Colonoscope was introduced through the anus and                            advanced to the the cecum, identified by               appendiceal orifice and ileocecal valve. The                            colonoscopy was performed without difficulty. The                            patient tolerated the procedure well. The quality                            of the bowel preparation was good. The ileocecal                            valve, appendiceal orifice, and rectum were                            photographed. Scope In: 2:43:22 PM Scope Out: 2:55:01 PM Scope Withdrawal Time: 0 hours 7 minutes 7 seconds  Total Procedure Duration: 0 hours 11 minutes 39 seconds  Findings:                 A 3 mm polyp was found in the descending colon. The                            polyp was sessile. The polyp was removed with a                            cold snare. Resection and retrieval were complete.                           Multiple small and large-mouthed diverticula were                            found in the entire colon.                           The exam was otherwise without abnormality on  direct and retroflexion views. Complications:            No immediate complications. Estimated blood loss:                            None. Estimated Blood Loss:     Estimated blood loss: none. Impression:               - One 3 mm polyp in the descending colon, removed                            with a cold snare. Resected and retrieved.                           - Diverticulosis in the entire examined colon.                           - The examination was otherwise normal on direct                            and retroflexion views. Recommendation:           - EGD now to continue workup for hemoccult positive                            stool.                           - Await pathology results. Milus Banister, MD 10/04/2019 3:15:15 PM This report has been signed electronically.

## 2019-10-04 NOTE — Progress Notes (Signed)
A and O x3. Report to RN. Tolerated MAC anesthesia well.Teeth unchanged after procedure.

## 2019-10-04 NOTE — Op Note (Signed)
[facility] Patient Name: Quartez Bradeen Procedure Date: 10/04/2019 2:34 PM MRN: [id number] Endoscopist: Milus Banister , MD Age: 68 Referring MD:  Date of Birth: [birth year] Gender: Male Account #: [id number] Procedure:                Upper GI endoscopy Indications:              Occult blood in stool Medicines:                Monitored Anesthesia Care Procedure:                Pre-Anesthesia Assessment:                           - Prior to the procedure, a History and Physical                            was performed, and patient medications and                            allergies were reviewed. The patient's tolerance of                            previous anesthesia was also reviewed. The risks                            and benefits of the procedure and the sedation                            options and risks were discussed with the patient.                            All questions were answered, and informed consent                            was obtained. Prior Anticoagulants: The patient has                            taken no previous anticoagulant or antiplatelet                            agents. ASA Grade Assessment: II - A patient with                            mild systemic disease. After reviewing the risks                            and benefits, the patient was deemed in                            satisfactory condition to undergo the procedure.                           After obtaining informed consent, the endoscope was  passed under direct vision. Throughout the                            procedure, the patient's blood pressure, pulse, and                            oxygen saturations were monitored continuously. The                            Endoscope was introduced through the mouth, and                            advanced to the second part of duodenum. The upper                            GI endoscopy was accomplished  without difficulty.                            The patient tolerated the procedure well. Scope In: Scope Out: Findings:                 Very mild inflammation characterized by friability                            and granularity was found in the gastric antrum.                            Biopsies were taken with a cold forceps for                            histology.                           A small hiatal hernia was present. Complications:            No immediate complications. Estimated blood loss:                            None. Estimated Blood Loss:     Estimated blood loss: none. Impression:               - Minimal gastritis, biospied.                           - Small hiatal hernia. Recommendation:           - Patient has a contact number available for                            emergencies. The signs and symptoms of potential                            delayed complications were discussed with the                            patient. Return to normal activities tomorrow.  Written discharge instructions were provided to the                            patient.                           - Resume previous diet.                           - Continue present medications.                           - Await pathology results. Milus Banister, MD 10/04/2019 3:17:36 PM This report has been signed electronically.

## 2019-10-06 ENCOUNTER — Telehealth: Payer: Self-pay

## 2019-10-06 NOTE — Telephone Encounter (Signed)
No answer, left message to call if having any issues or concerns, B.Jakyia Gaccione RN 

## 2019-10-06 NOTE — Telephone Encounter (Signed)
First post procedure follow up call, no answer 

## 2020-01-09 NOTE — Progress Notes (Signed)
Cardiology Office Note   Date:  01/10/2020   ID:  Ronald Pummel, MD, DOB Jan 04, 1951, MRN [id number]  PCP:  [name], MD    No chief complaint on file.  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 Cyndia Bent. Of note, prior to AVR, he had a LHC which showed no CAD.  His AVR was on 08/29/16. He received a 23 mm Edwards Magna-Ease pericardialvalve. His post surgical course was complicated by atrial fibrillation. However, he converted andwas in NSR at time of discharge on 09/03/16. He called the office on 09/07/16, stating that he felt that he was back in atrial fibrillation. He was instructed to take an extra metoprolol and he successfully converted back to NSR. He had a second recurrence of an irregular heart rate, several days later, however it only lasted ~15 min.  He had some leg swellingin the past. He thinks it was related to salt intake. He took Lasix for a few days and ankles came back to normal.  In July 2018, he noticed some decreased exercise tolerance. He decreased his alcohol intake. He tried Lasix without relief. He had an echo and the valve was ok. He was having some muscle pain. He stopped his statin, and feels that over time, his exercise tolerance increased.   In Dec 2019, he fell on the stairs while carrying Christmas decorations.  He tore his left quadriceps tendon and had surgery.  In late 2020, he had heme positive stool.  This was worked up with Dr. Ardis Hughs- negative EGD/colonoscopy.    Denies : Chest pain. Dizziness. Leg edema. Nitroglycerin use. Orthopnea.  Palpitations. Paroxysmal nocturnal dyspnea. Shortness of breath. Syncope.   Home readings are in the [id number] systolic.    Past Medical History:  Diagnosis Date  . Anxiety   . Aortic stenosis   . Dyspnea    with exertion  . Hypertension   . Insomnia     Past Surgical History:  Procedure Laterality Date  . AORTIC VALVE REPLACEMENT N/A 08/29/2016   Procedure: AORTIC VALVE REPLACEMENT (AVR);  Surgeon: Gaye Pollack, MD;  Location: [facility];  Service: Open Heart Surgery;  Laterality: N/A;  . CARDIAC CATHETERIZATION N/A 12/25/2015   Procedure: Right/Left Heart Cath and Coronary Angiography;  Surgeon: Jettie Booze, MD;  Location: [facility];  Service: Cardiovascular;  Laterality: N/A;  . SUPRAVALVULAR AORTIC STENOSIS REPAIR N/A 2006   not done per patiebt  . TEE WITHOUT CARDIOVERSION N/A 08/29/2016   Procedure: TRANSESOPHAGEAL ECHOCARDIOGRAM (TEE);  Surgeon: Gaye Pollack, MD;  Location: [facility];  Service: Open Heart Surgery;  Laterality: N/A;  . TONSILLECTOMY       Current Outpatient Medications  Medication Sig Dispense Refill  . amLODipine (NORVASC) 10 MG tablet Take 1 tablet (10 mg total) by mouth daily. 90 tablet 3  . aspirin 81 MG chewable tablet Chew 81 mg by mouth daily.    Marland Kitchen DOXYLAMINE SUCCINATE PO Take by mouth.    . furosemide (LASIX) 20 MG tablet Take 20 mg by mouth daily.    Marland Kitchen  losartan (COZAAR) 25 MG tablet Take 1 tablet (25 mg total) by mouth daily. Please keep upcoming appt in February for future refills. Thank you 90 tablet 0  . metoprolol succinate (TOPROL-XL) 50 MG 24 hr tablet TAKE 1 AND 1/2 TABLETS EVERY DAY 135 tablet 3   No current facility-administered medications for this visit.    Allergies:   Lisinopril    Social History:  The patient  reports that he quit smoking about 19 years ago. His smoking use included cigarettes. He quit after 20.00 years of use. He has never used smokeless tobacco. He reports current alcohol use of about 21.0 standard  drinks of alcohol per week. He reports that he does not use drugs.   Family History:  The patient's family history includes Brain cancer in his father; CAD in his mother; Cardiomyopathy in his mother; Diabetes in his mother; Heart disease in his mother; Hypertension in his mother.    ROS:  Please see the history of present illness.   Otherwise, review of systems are positive for knee pain with running.   All other systems are reviewed and negative.    PHYSICAL EXAM: VS:  BP (!) 152/84   Pulse (!) 52   Ht 5\' 9"  (1.753 m)   Wt 167 lb 1.9 oz (75.8 kg)   SpO2 98%   BMI 24.68 kg/m  , BMI Body mass index is 24.68 kg/m. GEN: Well nourished, well developed, in no acute distress  HEENT: normal  Neck: no JVD, carotid bruits, or masses Cardiac: RRR; 2/6 murmurs,no rubs, or gallops,no edema  Respiratory:  clear to auscultation bilaterally, normal work of breathing GI: soft, nontender, nondistended, + BS MS: no deformity or atrophy  Skin: warm and dry, no rash Neuro:  Strength and sensation are intact Psych: euthymic mood, full affect   EKG:   The ekg ordered today demonstrates NSR, no ST segment   Recent Labs: No results found for requested labs within last 8760 hours.   Lipid Panel    Component Value Date/Time   CHOL 167 09/15/2017 1013   TRIG 94 09/15/2017 1013   HDL 39 (L) 09/15/2017 1013   CHOLHDL 4.3 09/15/2017 1013   CHOLHDL 2.5 01/31/2016 0749   VLDL 16 01/31/2016 0749   LDLCALC 109 (H) 09/15/2017 1013     Other studies Reviewed: Additional studies/ records that were reviewed today with results demonstrating: labs reviewed.   ASSESSMENT AND PLAN:  1. S/p AVR: SBE prophylaxis recommended. 2. PAF: This occurred after his surgery.  No palpitations. 3. Hyperlipidemia: LDL 118.  He took statins in the past, but had fatigue and joint pains.  Resolved with stopping statins. 4. Hypertension: 140/68 on recheck.  Controlled at home.  5. Of note, he had a pilot's license in  the past.  He noted that his memory and hearing were not what they used to be and stopped pursuing renewal of his pilot's license.   Current medicines are reviewed at length with the patient today.  The patient concerns regarding his medicines were addressed.  The following changes have been made:  No change  Labs/ tests ordered today include:  No orders of the defined types were placed in this encounter.   Recommend 150 minutes/week of aerobic exercise Low fat, low carb, high fiber diet recommended  Disposition:   FU in 1 year   Signed, Larae Grooms, MD  01/10/2020 9:02 AM    [address], [place], [place]  [address]  Phone: [phone]; Fax: [phone]

## 2020-01-10 ENCOUNTER — Ambulatory Visit (INDEPENDENT_AMBULATORY_CARE_PROVIDER_SITE_OTHER): Payer: Medicare Other | Admitting: Interventional Cardiology

## 2020-01-10 ENCOUNTER — Encounter: Payer: Self-pay | Admitting: Interventional Cardiology

## 2020-01-10 ENCOUNTER — Other Ambulatory Visit: Payer: Self-pay

## 2020-01-10 VITALS — BP 152/84 | HR 52 | Ht 69.0 in | Wt 167.1 lb

## 2020-01-10 DIAGNOSIS — E782 Mixed hyperlipidemia: Secondary | ICD-10-CM | POA: Diagnosis not present

## 2020-01-10 DIAGNOSIS — I10 Essential (primary) hypertension: Secondary | ICD-10-CM | POA: Diagnosis not present

## 2020-01-10 DIAGNOSIS — I480 Paroxysmal atrial fibrillation: Secondary | ICD-10-CM | POA: Diagnosis not present

## 2020-01-10 DIAGNOSIS — Z952 Presence of prosthetic heart valve: Secondary | ICD-10-CM

## 2020-01-10 MED ORDER — AMLODIPINE BESYLATE 10 MG PO TABS: 10.0000 mg | ORAL_TABLET | Freq: Every day | ORAL | 3 refills | Status: DC

## 2020-01-10 MED ORDER — METOPROLOL SUCCINATE ER 50 MG PO TB24: ORAL_TABLET | ORAL | 3 refills | Status: DC

## 2020-01-10 MED ORDER — LOSARTAN POTASSIUM 25 MG PO TABS: 25.0000 mg | ORAL_TABLET | Freq: Every day | ORAL | 3 refills | Status: DC

## 2020-01-10 NOTE — Patient Instructions (Signed)

## 2020-11-19 NOTE — Progress Notes (Signed)
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 [name]. Of note, prior to AVR, he had a LHC which showed no CAD.  His AVR was on 08/29/16. He received a 23 mm Edwards Magna-Ease pericardialvalve. His post surgical course was complicated by atrial fibrillation. However, he converted andwas in NSR at time of discharge on 09/03/16. He called the office on 09/07/16, stating that he felt that he was back in atrial fibrillation. He was instructed to take an extra metoprolol and he successfully converted back to NSR. He had a second recurrence of an irregular heart rate, several days later, however it only lasted ~15 min.  He had some leg swellingin the past. He thinks it was related to salt intake. He took Lasix for a few days and ankles came back to normal.  In July 2018, he noticed some decreased exercise tolerance. He decreased his alcohol intake. He tried Lasix without relief. He had an echo and the valve was ok. He was having some muscle pain. He stopped his statin, and feels that over time, his exercise tolerance increased.  In Dec 2019, he fell on the stairs while carrying Christmas decorations. He tore his left quadriceps tendon and had surgery.  In late 2020, he had heme positive stool.  This was worked up with Dr. Christella Hartigan- negative EGD/colonoscopy.    Of note, he had a pilot's license in the past.  He noted that his memory and hearing were not what they used to be and stopped  pursuing renewal of his pilot's license.  Since the last visit, he continues to walk regularly outside or on the treadmill.  Quadriceps tendon rupture has limited nore strenuous exercise.    He had 3 Moderna shots as well.    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   He was concerned about his LDL as well.       Past Medical History:  Diagnosis Date  . Anxiety   . Aortic stenosis   . Dyspnea    with exertion  . Hypertension   . Insomnia     Past Surgical History:  Procedure Laterality Date  . AORTIC VALVE REPLACEMENT N/A 08/29/2016   Procedure: AORTIC VALVE REPLACEMENT (AVR);  Surgeon: [name], MD;  Location: [facility] OR;  Service: Open Heart Surgery;  Laterality: N/A;  . CARDIAC CATHETERIZATION N/A 12/25/2015   Procedure: Right/Left Heart Cath and Coronary Angiography;  Surgeon: [name], MD;  Location: [facility] INVASIVE CV LAB;  Service: Cardiovascular;  Laterality: N/A;  . SUPRAVALVULAR AORTIC STENOSIS REPAIR N/A 2006   not done per patiebt  . TEE WITHOUT CARDIOVERSION N/A 08/29/2016   Procedure: TRANSESOPHAGEAL ECHOCARDIOGRAM (TEE);  Surgeon: [name], MD;  Location: [facility] OR;  Service: Open Heart Surgery;  Laterality: N/A;  . TONSILLECTOMY       Current Outpatient Medications  Medication Sig Dispense Refill  . amLODipine (NORVASC)  10 MG tablet Take 1 tablet (10 mg total) by mouth daily. 90 tablet 3  . aspirin 81 MG chewable tablet Chew 81 mg by mouth daily.    . furosemide (LASIX) 20 MG tablet Take 20 mg by mouth daily.    Marland Kitchen losartan (COZAAR) 25 MG tablet Take 1 tablet (25 mg total) by mouth daily. 90 tablet 3  . metoprolol succinate (TOPROL-XL) 50 MG 24 hr tablet TAKE 1 AND 1/2 TABLETS EVERY DAY 135 tablet 3   No current facility-administered medications for this visit.    Allergies:   Lisinopril    Social History:  The patient  reports that he quit smoking about 20 years ago. His smoking use included cigarettes. He quit after 20.00 years  of use. He has never used smokeless tobacco. He reports current alcohol use of about 21.0 standard drinks of alcohol per week. He reports that he does not use drugs.   Family History:  The patient's family history includes Brain cancer in his father; CAD in his mother; Cardiomyopathy in his mother; Diabetes in his mother; Heart disease in his mother; Hypertension in his mother.    ROS:  Please see the history of present illness.   Otherwise, review of systems are positive for minimal DOE.   All other systems are reviewed and negative.    PHYSICAL EXAM: VS:  BP 136/76   Pulse 63   Ht 5\' 9"  (1.753 m)   Wt 167 lb (75.8 kg)   SpO2 98%   BMI 24.66 kg/m  , BMI Body mass index is 24.66 kg/m. GEN: Well nourished, well developed, in no acute distress  HEENT: normal  Neck: no JVD, carotid bruits, or masses Cardiac: RRR; no murmurs, rubs, or gallops,no edema  Respiratory:  clear to auscultation bilaterally, normal work of breathing GI: soft, nontender, nondistended, + BS MS: no deformity or atrophy  Skin: warm and dry, no rash Neuro:  Strength and sensation are intact Psych: euthymic mood, full affect   EKG:   The ekg ordered today demonstrates NSR, no ST changes   Recent Labs: No results found for requested labs within last 8760 hours.   Lipid Panel    Component Value Date/Time   CHOL 167 09/15/2017 1013   TRIG 94 09/15/2017 1013   HDL 39 (L) 09/15/2017 1013   CHOLHDL 4.3 09/15/2017 1013   CHOLHDL 2.5 01/31/2016 0749   VLDL 16 01/31/2016 0749   LDLCALC 109 (H) 09/15/2017 1013     Other studies Reviewed: Additional studies/ records that were reviewed today with results demonstrating: labs reviewed, LDL 124.   ASSESSMENT AND PLAN:  1. S/p AVR: Valve appears to be functioning well. He has had some mild DOE.  Check echocardiogram.  He will be travelling to the [place].   2. PAF: In NSR.   3. Hyperlipidemia: LDL 124 in 10/21.  He stopped ice cream.  He had stopped  atorvastatin in the past due to an intolerance.  Start Crestor 5 mg daily.  Check lipids in 2-3 months along with liver tests.  4. HTN: The current medical regimen is effective;  continue present plan and medications.  healthy diet.  5. 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    Current medicines are reviewed at length with the patient today.  The patient concerns regarding his medicines were addressed.  The following changes have been made:  No change  Labs/ tests ordered today include:  No orders of the defined types were placed in this  encounter.   Recommend 150 minutes/week of aerobic exercise Low fat, low carb, high fiber diet recommended  Disposition:   FU in 1 year   Signed, Larae Grooms, MD  11/20/2020 1:42 PM    [facility] [address], [place], [place]  [address] Phone: [phone]; Fax: [phone]

## 2020-11-20 ENCOUNTER — Encounter: Payer: Self-pay | Admitting: Interventional Cardiology

## 2020-11-20 ENCOUNTER — Ambulatory Visit (INDEPENDENT_AMBULATORY_CARE_PROVIDER_SITE_OTHER): Payer: Medicare Other | Admitting: Interventional Cardiology

## 2020-11-20 ENCOUNTER — Other Ambulatory Visit: Payer: Self-pay

## 2020-11-20 VITALS — BP 136/76 | HR 63 | Ht 69.0 in | Wt 167.0 lb

## 2020-11-20 DIAGNOSIS — E782 Mixed hyperlipidemia: Secondary | ICD-10-CM

## 2020-11-20 DIAGNOSIS — I480 Paroxysmal atrial fibrillation: Secondary | ICD-10-CM

## 2020-11-20 DIAGNOSIS — Z952 Presence of prosthetic heart valve: Secondary | ICD-10-CM | POA: Diagnosis not present

## 2020-11-20 DIAGNOSIS — I10 Essential (primary) hypertension: Secondary | ICD-10-CM

## 2020-11-20 MED ORDER — ROSUVASTATIN CALCIUM 5 MG PO TABS: 5.0000 mg | ORAL_TABLET | Freq: Every day | ORAL | 3 refills | Status: DC

## 2020-11-20 NOTE — Patient Instructions (Addendum)
Medication Instructions:  Your physician has recommended you make the following change in your medication:   1-START Crestor 5 mg by mouth daily.  *If you need a refill on your cardiac medications before your next appointment, please call your pharmacy*   Lab Work: Your physician recommends that you return for lab work in: 2 to 3 months for fasting lipid and liver panel.  If you have labs (blood work) drawn today and your tests are completely normal, you will receive your results only by: Marland Kitchen MyChart Message (if you have MyChart) OR . A paper copy in the mail If you have any lab test that is abnormal or we need to change your treatment, we will call you to review the results.  Testing/Procedures: Your physician has requested that you have an echocardiogram. Echocardiography is a painless test that uses sound waves to create images of your heart. It provides your doctor with information about the size and shape of your heart and how well your heart's chambers and valves are working. This procedure takes approximately one hour. There are no restrictions for this procedure.  Follow-Up: At [facility], you and your health needs are our priority.  As part of our continuing mission to provide you with exceptional heart care, we have created designated Provider Care Teams.  These Care Teams include your primary Cardiologist (physician) and Advanced Practice Providers (APPs -  Physician Assistants and Nurse Practitioners) who all work together to provide you with the care you need, when you need it.  We recommend signing up for the patient portal called "MyChart".  Sign up information is provided on this After Visit Summary.  MyChart is used to connect with patients for Virtual Visits (Telemedicine).  Patients are able to view lab/test results, encounter notes, upcoming appointments, etc.  Non-urgent messages can be sent to your provider as well.   To learn more about what you can do with MyChart, go  to [email or web address].    Your next appointment:   12  month(s)  The format for your next appointment:   In Person  Provider:   You may see Lance Muss, MD or one of the following Advanced Practice Providers on your designated Care Team:    Ronie Spies, PA-C  Jacolyn Reedy, PA-C

## 2020-12-06 ENCOUNTER — Other Ambulatory Visit (HOSPITAL_COMMUNITY): Payer: Medicare Other

## 2020-12-06 ENCOUNTER — Ambulatory Visit (HOSPITAL_COMMUNITY): Payer: Medicare Other | Attending: Cardiovascular Disease

## 2020-12-06 ENCOUNTER — Other Ambulatory Visit: Payer: Self-pay

## 2020-12-06 DIAGNOSIS — Z952 Presence of prosthetic heart valve: Secondary | ICD-10-CM | POA: Diagnosis present

## 2020-12-06 DIAGNOSIS — I10 Essential (primary) hypertension: Secondary | ICD-10-CM | POA: Insufficient documentation

## 2020-12-06 DIAGNOSIS — I480 Paroxysmal atrial fibrillation: Secondary | ICD-10-CM | POA: Insufficient documentation

## 2020-12-06 DIAGNOSIS — E782 Mixed hyperlipidemia: Secondary | ICD-10-CM | POA: Diagnosis present

## 2020-12-06 LAB — ECHOCARDIOGRAM COMPLETE
AR max vel: 2.51 cm2
AV Area VTI: 2.39 cm2
AV Area mean vel: 2.47 cm2
AV Mean grad: 9 mmHg
AV Peak grad: 16 mmHg
Ao pk vel: 2 m/s
Area-P 1/2: 3.42 cm2
Calc EF: 59.1 %
S' Lateral: 3.4 cm
Single Plane A2C EF: 54 %
Single Plane A4C EF: 63.6 %

## 2020-12-26 ENCOUNTER — Other Ambulatory Visit (HOSPITAL_COMMUNITY): Payer: Medicare Other

## 2020-12-28 ENCOUNTER — Other Ambulatory Visit: Payer: Self-pay | Admitting: Interventional Cardiology

## 2021-02-14 ENCOUNTER — Other Ambulatory Visit: Payer: Medicare Other | Admitting: *Deleted

## 2021-02-14 ENCOUNTER — Other Ambulatory Visit: Payer: Self-pay

## 2021-02-14 DIAGNOSIS — I480 Paroxysmal atrial fibrillation: Secondary | ICD-10-CM

## 2021-02-14 DIAGNOSIS — E782 Mixed hyperlipidemia: Secondary | ICD-10-CM

## 2021-02-14 DIAGNOSIS — I10 Essential (primary) hypertension: Secondary | ICD-10-CM

## 2021-02-14 DIAGNOSIS — Z952 Presence of prosthetic heart valve: Secondary | ICD-10-CM

## 2021-02-14 LAB — HEPATIC FUNCTION PANEL
ALT: 29 IU/L (ref 0–44)
AST: 25 IU/L (ref 0–40)
Albumin: 4.8 g/dL (ref 3.8–4.8)
Alkaline Phosphatase: 68 IU/L (ref 44–121)
Bilirubin Total: 0.5 mg/dL (ref 0.0–1.2)
Bilirubin, Direct: 0.15 mg/dL (ref 0.00–0.40)
Total Protein: 7 g/dL (ref 6.0–8.5)

## 2021-02-14 LAB — LIPID PANEL
Chol/HDL Ratio: 3 ratio (ref 0.0–5.0)
Cholesterol, Total: 131 mg/dL (ref 100–199)
HDL: 43 mg/dL (ref >39)
LDL Chol Calc (NIH): 68 mg/dL (ref 0–99)
Triglycerides: 111 mg/dL (ref 0–149)
VLDL Cholesterol Cal: 20 mg/dL (ref 5–40)

## 2021-10-29 ENCOUNTER — Other Ambulatory Visit: Payer: Self-pay | Admitting: Interventional Cardiology

## 2021-12-09 NOTE — Progress Notes (Signed)
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 Cyndia Bent. Of note, prior to AVR, he had a LHC which showed no CAD.   His AVR was on 08/29/16. He received a 23 mm Edwards Magna-Ease pericardial valve. His post surgical course was complicated by atrial fibrillation. However, he converted and was in NSR at time of discharge on 09/03/16. He called the office on 09/07/16, stating that he felt that he was back in atrial fibrillation. He was instructed to take an extra metoprolol and he successfully converted back to NSR. He had a second recurrence of an irregular heart rate, several days later, however it only lasted ~15 min.   He had some leg swelling in the past.  He thinks it was related to salt intake.  He took Lasix for a few days and ankles came back to normal.   In July 2018, he noticed some decreased exercise tolerance.  He decreased his alcohol intake. He tried Lasix without relief. He had an echo and the valve was ok.  He was having some muscle pain.  He stopped his statin, and feels that over time, his exercise tolerance increased.     In Dec 2019, he fell on the stairs while carrying Christmas decorations.  He tore his left quadriceps tendon and had surgery.   In late 2020, he had heme positive stool.  This was worked up with Dr. Ardis Hughs- negative EGD/colonoscopy.     Of note, he had a pilot's license in the past.  He noted that his memory and hearing were not what  they used to be and stopped pursuing renewal of his pilot's license.   Had quadriceps tendon rupture in 2021.   He had 3 Moderna shots as well.     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    He was concerned about his LDL as well.    Was intolerant of atorvastatin in the past.    A1C increased in 08/2021 to 7.9.  Diet has improved since then. Decreasing carb intake.  Exercise is decreased, walking now mostly.    LDL 62 in 08/2021.   Denies : Chest pain. Dizziness. Leg edema. Nitroglycerin use. Orthopnea. Palpitations. Paroxysmal nocturnal dyspnea. Shortness of breath. Syncope.     Past Medical History:  Diagnosis Date   Anxiety    Aortic stenosis    Dyspnea    with exertion   Hypertension    Insomnia     Past Surgical History:  Procedure Laterality Date   AORTIC VALVE REPLACEMENT N/A 08/29/2016   Procedure: AORTIC VALVE REPLACEMENT (AVR);  Surgeon: Gaye Pollack, MD;  Location: [facility];  Service: Open Heart Surgery;  Laterality: N/A;   CARDIAC CATHETERIZATION N/A 12/25/2015   Procedure: Right/Left Heart Cath and Coronary Angiography;  Surgeon: Jettie Booze, MD;  Location: [facility] CV LAB;  Service: Cardiovascular;  Laterality: N/A;   SUPRAVALVULAR AORTIC STENOSIS REPAIR N/A 2006   not done per patiebt   TEE WITHOUT CARDIOVERSION N/A 08/29/2016   Procedure: TRANSESOPHAGEAL ECHOCARDIOGRAM (TEE);  Surgeon: Gaye Pollack, MD;  Location: [facility];  Service: Open Heart Surgery;  Laterality: N/A;   TONSILLECTOMY       Current Outpatient Medications  Medication Sig Dispense Refill   amLODipine (NORVASC) 10 MG tablet TAKE 1 TABLET BY MOUTH EVERY DAY 90 tablet 3   aspirin 81 MG chewable tablet Chew 81 mg by mouth daily.     furosemide (LASIX) 20 MG tablet Take 20 mg by mouth daily.     losartan (COZAAR) 25 MG tablet TAKE 1 TABLET BY MOUTH EVERY DAY 90 tablet 3   metoprolol succinate (TOPROL-XL) 50 MG 24 hr tablet TAKE 1 AND 1/2 TABLETS BY MOUTH EVERY DAY 135  tablet 3   rosuvastatin (CRESTOR) 5 MG tablet TAKE 1 TABLET (5 MG TOTAL) BY MOUTH DAILY. 90 tablet 0   No current facility-administered medications for this visit.    Allergies:   Lisinopril    Social History:  The patient  reports that he quit smoking about 21 years ago. His smoking use included cigarettes. He has never used smokeless tobacco. He reports current alcohol use of about 21.0 standard drinks per week. He reports that he does not use drugs.   Family History:  The patient's family history includes Brain cancer in his father; CAD in his mother; Cardiomyopathy in his mother; Diabetes in his mother; Heart disease in his mother; Hypertension in his mother.    ROS:  Please see the history of present illness.   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   All other systems are reviewed and negative.    PHYSICAL EXAM: VS:  BP 116/76    Pulse (!) 57    Ht 5\' 8"  (1.727 m)    Wt 170 lb (77.1 kg)    SpO2 99%    BMI 25.85 kg/m  , BMI Body mass index is 25.85 kg/m. GEN: Well nourished, well developed, in no acute distress HEENT: normal Neck: no JVD, carotid bruits, or masses Cardiac: RRR; 2/6 systolic murmur, no rubs, or gallops,no edema  Respiratory:  clear to auscultation bilaterally, normal work of breathing GI: soft, nontender, nondistended, + BS MS: no deformity or atrophy Skin: warm and dry, no rash Neuro:  Strength and sensation are intact Psych: euthymic mood, full affect   EKG:   The ekg ordered today demonstrates sinus bradycardia, no ST changes   Recent Labs: 02/14/2021: ALT 29   Lipid Panel    Component Value Date/Time   CHOL 131 02/14/2021 0753   TRIG 111 02/14/2021 0753   HDL 43 02/14/2021 0753   CHOLHDL 3.0 02/14/2021 0753   CHOLHDL 2.5 01/31/2016 0749   VLDL 16 01/31/2016 0749   LDLCALC 68 02/14/2021 0753     Other studies Reviewed: Additional studies/ records that were reviewed today with results  demonstrating: LDL 62 in 10/22.   ASSESSMENT AND PLAN:  S/p AVR: Valve functioning well.  SBE prophylaxis for dental treatments.  His dentist gives him the antibiotics.  PAF: In NSR.  Hyperlipidemia: The current medical regimen is effective;  continue present plan and medications. HTN: The current medical regimen is effective;  continue present plan and medications.  Avoid excessive salt. PreDM: A1C increased.  Trying to get it down with diet.  TO be rechecked tomorrow.  High-fiber diet.  Avoid processed foods.  Whole food, plant-based diet.   Current medicines are reviewed at length with the patient today.  The patient concerns regarding his medicines were addressed.  The following changes have been made:  No change  Labs/ tests ordered today include:  No orders of the defined types were placed in this encounter.   Recommend 150 minutes/week of aerobic exercise Low fat, low carb, high fiber diet recommended  Disposition:   FU in 1 year    Signed, Larae Grooms, MD  12/10/2021 9:10 AM    [address], [place], [place]  [address] Phone: [phone]; Fax: [phone]

## 2021-12-10 ENCOUNTER — Other Ambulatory Visit: Payer: Self-pay

## 2021-12-10 ENCOUNTER — Encounter: Payer: Self-pay | Admitting: Interventional Cardiology

## 2021-12-10 ENCOUNTER — Ambulatory Visit (INDEPENDENT_AMBULATORY_CARE_PROVIDER_SITE_OTHER): Payer: Medicare Other | Admitting: Interventional Cardiology

## 2021-12-10 VITALS — BP 116/76 | HR 57 | Ht 68.0 in | Wt 170.0 lb

## 2021-12-10 DIAGNOSIS — I480 Paroxysmal atrial fibrillation: Secondary | ICD-10-CM

## 2021-12-10 DIAGNOSIS — E782 Mixed hyperlipidemia: Secondary | ICD-10-CM

## 2021-12-10 DIAGNOSIS — I10 Essential (primary) hypertension: Secondary | ICD-10-CM | POA: Diagnosis not present

## 2021-12-10 DIAGNOSIS — Z952 Presence of prosthetic heart valve: Secondary | ICD-10-CM

## 2021-12-10 MED ORDER — AMLODIPINE BESYLATE 10 MG PO TABS: 10.0000 mg | ORAL_TABLET | Freq: Every day | ORAL | 3 refills | Status: DC

## 2021-12-10 MED ORDER — LOSARTAN POTASSIUM 25 MG PO TABS: 25.0000 mg | ORAL_TABLET | Freq: Every day | ORAL | 3 refills | Status: DC

## 2021-12-10 MED ORDER — METOPROLOL SUCCINATE ER 50 MG PO TB24: ORAL_TABLET | ORAL | 3 refills | Status: DC

## 2021-12-10 MED ORDER — ROSUVASTATIN CALCIUM 5 MG PO TABS: 5.0000 mg | ORAL_TABLET | Freq: Every day | ORAL | 3 refills | Status: DC

## 2021-12-10 NOTE — Patient Instructions (Signed)
Medication Instructions:  Your physician recommends that you continue on your current medications as directed. Please refer to the Current Medication list given to you today.  *If you need a refill on your cardiac medications before your next appointment, please call your pharmacy*   Lab Work: none If you have labs (blood work) drawn today and your tests are completely normal, you will receive your results only by: MyChart Message (if you have MyChart) OR A paper copy in the mail If you have any lab test that is abnormal or we need to change your treatment, we will call you to review the results.   Testing/Procedures: none   Follow-Up: At [facility], you and your health needs are our priority.  As part of our continuing mission to provide you with exceptional heart care, we have created designated Provider Care Teams.  These Care Teams include your primary Cardiologist (physician) and Advanced Practice Providers (APPs -  Physician Assistants and Nurse Practitioners) who all work together to provide you with the care you need, when you need it.  We recommend signing up for the patient portal called "MyChart".  Sign up information is provided on this After Visit Summary.  MyChart is used to connect with patients for Virtual Visits (Telemedicine).  Patients are able to view lab/test results, encounter notes, upcoming appointments, etc.  Non-urgent messages can be sent to your provider as well.   To learn more about what you can do with MyChart, go to [email or web address].    Your next appointment:   12 month(s)  The format for your next appointment:   In Person  Provider:   Jayadeep Varanasi, MD     Other Instructions  High-Fiber Eating Plan Fiber, also called dietary fiber, is a type of carbohydrate. It is found foods such as fruits, vegetables, whole grains, and beans. A high-fiber diet can have many health benefits. Your health care provider may recommend a high-fiber diet  to help: Prevent constipation. Fiber can make your bowel movements more regular. Lower your cholesterol. Relieve the following conditions: Inflammation of veins in the anus (hemorrhoids). Inflammation of specific areas of the digestive tract (uncomplicated diverticulosis). A problem of the large intestine, also called the colon, that sometimes causes pain and diarrhea (irritable bowel syndrome, or IBS). Prevent overeating as part of a weight-loss plan. Prevent heart disease, type 2 diabetes, and certain cancers. What are tips for following this plan? Reading food labels  Check the nutrition facts label on food products for the amount of dietary fiber. Choose foods that have 5 grams of fiber or more per serving. The goals for recommended daily fiber intake include: Men (age 50 or younger): 34-38 g. Men (over age 50): 28-34 g. Women (age 50 or younger): 25-28 g. Women (over age 50): 22-25 g. Your daily fiber goal is _____________ g. Shopping Choose whole fruits and vegetables instead of processed forms, such as apple juice or applesauce. Choose a wide variety of high-fiber foods such as avocados, lentils, oats, and kidney beans. Read the nutrition facts label of the foods you choose. Be aware of foods with added fiber. These foods often have high sugar and sodium amounts per serving. Cooking Use whole-grain flour for baking and cooking. Cook with brown rice instead of white rice. Meal planning Start the day with a breakfast that is high in fiber, such as a cereal that contains 5 g of fiber or more per serving. Eat breads and cereals that are made with whole-grain flour instead of   refined flour or white flour. Eat brown rice, bulgur wheat, or millet instead of white rice. Use beans in place of meat in soups, salads, and pasta dishes. Be sure that half of the grains you eat each day are whole grains. General information You can get the recommended daily intake of dietary fiber  by: Eating a variety of fruits, vegetables, grains, nuts, and beans. Taking a fiber supplement if you are not able to take in enough fiber in your diet. It is better to get fiber through food than from a supplement. Gradually increase how much fiber you consume. If you increase your intake of dietary fiber too quickly, you may have bloating, cramping, or gas. Drink plenty of water to help you digest fiber. Choose high-fiber snacks, such as berries, raw vegetables, nuts, and popcorn. What foods should I eat? Fruits Berries. Pears. Apples. Oranges. Avocado. Prunes and raisins. Dried figs. Vegetables Sweet potatoes. Spinach. Kale. Artichokes. Cabbage. Broccoli. Cauliflower. Green peas. Carrots. Squash. Grains Whole-grain breads. Multigrain cereal. Oats and oatmeal. Brown rice. Barley. Bulgur wheat. Millet. Quinoa. Bran muffins. Popcorn. Rye wafer crackers. Meats and other proteins Navy beans, kidney beans, and pinto beans. Soybeans. Split peas. Lentils. Nuts and seeds. Dairy Fiber-fortified yogurt. Beverages Fiber-fortified soy milk. Fiber-fortified orange juice. Other foods Fiber bars. The items listed above may not be a complete list of recommended foods and beverages. Contact a dietitian for more information. What foods should I avoid? Fruits Fruit juice. Cooked, strained fruit. Vegetables Fried potatoes. Canned vegetables. Well-cooked vegetables. Grains White bread. Pasta made with refined flour. White rice. Meats and other proteins Fatty cuts of meat. Fried chicken or fried fish. Dairy Milk. Yogurt. Cream cheese. Sour cream. Fats and oils Butters. Beverages Soft drinks. Other foods Cakes and pastries. The items listed above may not be a complete list of foods and beverages to avoid. Talk with your dietitian about what choices are best for you. Summary Fiber is a type of carbohydrate. It is found in foods such as fruits, vegetables, whole grains, and beans. A high-fiber  diet has many benefits. It can help to prevent constipation, lower blood cholesterol, aid weight loss, and reduce your risk of heart disease, diabetes, and certain cancers. Increase your intake of fiber gradually. Increasing fiber too quickly may cause cramping, bloating, and gas. Drink plenty of water while you increase the amount of fiber you consume. The best sources of fiber include whole fruits and vegetables, whole grains, nuts, seeds, and beans. This information is not intended to replace advice given to you by your health care provider. Make sure you discuss any questions you have with your health care provider. Document Revised: 03/09/2020 Document Reviewed: 03/09/2020 Elsevier Patient Education  2022 [employer]   

## 2022-10-21 LAB — LAB REPORT - SCANNED: EGFR: 66.4

## 2022-10-21 LAB — HEMOGLOBIN A1C: A1c: 7.2

## 2022-11-17 ENCOUNTER — Other Ambulatory Visit: Payer: Self-pay | Admitting: Interventional Cardiology

## 2022-11-21 ENCOUNTER — Other Ambulatory Visit: Payer: Self-pay | Admitting: Interventional Cardiology

## 2023-01-09 ENCOUNTER — Other Ambulatory Visit: Payer: Self-pay | Admitting: Interventional Cardiology

## 2023-02-12 NOTE — Progress Notes (Unsigned)
Cardiology Office Note   Date:  02/13/2023   ID:  Ronald Pummel, MD, DOB 04-03-1951, MRN [id number]  PCP:  [name], MD    No chief complaint on file.  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 Cyndia Bent. Of note, prior to AVR, he had a LHC which showed no CAD.   His AVR was on 08/29/16. He received a 23 mm [employer] Magna-Ease pericardial valve. His post surgical course was complicated by atrial fibrillation. However, he converted and was in NSR at time of discharge on 09/03/16. He called the office on 09/07/16, stating that he felt that he was back in atrial fibrillation. He was instructed to take an extra metoprolol and he successfully converted back to NSR. He had a second recurrence of an irregular heart rate, several days later, however it only lasted ~15 min.   He had some leg swelling in the past.  He thinks it was related to salt intake.  He took Lasix for a few days and ankles came back to normal.   In July 2018, he noticed some decreased exercise tolerance.  He decreased his alcohol intake. He tried Lasix without relief. He had an echo and the valve was ok.  He was having some muscle pain.  He stopped his statin, and feels that over time, his exercise tolerance increased.     In Dec 2019, he fell on the stairs while carrying Christmas decorations.  He tore his left quadriceps tendon and had surgery.   In late 2020, he had heme positive stool.  This was worked up with Dr. Ardis Hughs- negative EGD/colonoscopy.     Of note, he had a pilot's license in the past.  He noted that his memory and hearing were not what they used to be and stopped  pursuing renewal of his pilot's license.   Had quadriceps tendon rupture in 2021.   He had 3 Moderna shots as well.     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    He was concerned about his LDL as well.     Was intolerant of atorvastatin in the past.    A1C increased in 08/2021 to 7.9.  Diet has improved since then. Decreasing carb intake.  Exercise is decreased, walking now mostly.     LDL 62 in 08/2021.   Denies : Chest pain. Dizziness. Leg edema. Nitroglycerin use. Orthopnea. Palpitations. Paroxysmal nocturnal dyspnea. Shortness of breath. Syncope.    Walks 50 miles/week. No sx.  Past Medical History:  Diagnosis Date   Anxiety    Aortic stenosis    Dyspnea    with exertion   Hypertension    Insomnia     Past Surgical History:  Procedure Laterality Date   AORTIC VALVE REPLACEMENT N/A 08/29/2016   Procedure: AORTIC VALVE REPLACEMENT (AVR);  Surgeon: Gaye Pollack, MD;  Location: [facility];  Service: Open Heart Surgery;  Laterality: N/A;   CARDIAC CATHETERIZATION N/A 12/25/2015   Procedure: Right/Left Heart Cath and Coronary Angiography;  Surgeon: Jettie Booze, MD;  Location: [facility];  Service: Cardiovascular;  Laterality: N/A;   SUPRAVALVULAR AORTIC STENOSIS REPAIR N/A 2006   not done per patiebt   TEE WITHOUT CARDIOVERSION N/A 08/29/2016   Procedure: TRANSESOPHAGEAL ECHOCARDIOGRAM (TEE);  Surgeon: Gaye Pollack, MD;  Location: [facility];  Service: Open Heart Surgery;  Laterality: N/A;   TONSILLECTOMY       Current Outpatient Medications  Medication Sig Dispense Refill   aspirin 81 MG chewable tablet Chew 81 mg by mouth daily.     furosemide (LASIX) 20 MG tablet Take 20 mg by mouth daily.     metFORMIN (GLUCOPHAGE) 500 MG tablet Take 500 mg by mouth 2 (two) times daily with a meal.     metoprolol succinate (TOPROL-XL) 100 MG 24 hr tablet Take 1 tablet (100 mg total) by mouth daily. Take with or immediately following a meal. 90 tablet 3    rosuvastatin (CRESTOR) 5 MG tablet Take 1 tablet (5 mg total) by mouth daily. 90 tablet 3   amLODipine (NORVASC) 10 MG tablet Take 1 tablet (10 mg total) by mouth daily. 90 tablet 3   losartan (COZAAR) 25 MG tablet Take 1 tablet (25 mg total) by mouth daily. 90 tablet 3   No current facility-administered medications for this visit.    Allergies:   Lisinopril    Social History:  The patient  reports that he quit smoking about 22 years ago. His smoking use included cigarettes. He has never used smokeless tobacco. He reports current alcohol use of about 21.0 standard drinks of alcohol per week. He reports that he does not use drugs.   Family History:  The patient's family history includes Brain cancer in his father; CAD in his mother; Cardiomyopathy in his mother; Diabetes in his mother; Heart disease in his mother; Hypertension in his mother.    ROS:  Please see the history of present illness.   Otherwise, review of systems are positive for .   All other systems are reviewed and negative.    PHYSICAL EXAM: VS:  BP 130/82   Pulse 60   Ht 5\' 9"  (1.753 m)   Wt 165 lb 6.4 oz (75 kg)   SpO2 96%   BMI 24.43 kg/m  , BMI Body mass index is 24.43 kg/m. GEN: Well nourished, well developed, in no acute distress HEENT: normal Neck: no JVD, carotid bruits, or masses Cardiac: RRR; no murmurs, rubs, or gallops,no edema  Respiratory:  clear to auscultation bilaterally, normal work of breathing GI: soft, nontender, nondistended, + BS MS: no deformity or atrophy Skin: warm and dry, no rash Neuro:  Strength and sensation are intact Psych: euthymic mood, full affect   EKG:   The ekg ordered today demonstrates normal    Recent Labs: No results found for requested labs within last 365 days.   Lipid Panel    Component Value Date/Time   CHOL 131 02/14/2021 0753   TRIG 111 02/14/2021 0753   HDL 43 02/14/2021 0753   CHOLHDL 3.0 02/14/2021 0753   CHOLHDL 2.5 01/31/2016 0749   VLDL 16  01/31/2016 0749   LDLCALC 68 02/14/2021 0753     Other studies Reviewed: Additional studies/ records that were reviewed today with results demonstrating: Labs reviewed as noted below.   ASSESSMENT AND PLAN:  Status post AVR: Needs SBE prophylaxis for dental treatment.  No signs of CHF.  Excellent exercise tolerance. PAF: A-fib was around the time of surgery. Hyperlipidemia: Whole food, plant-based diet.  High-fiber diet.  Avoid processed foods.  LDL 64,  triglycerides 108, HDL 38, total cholesterol 124 in December 2023, checked with primary care doctor. Hypertension: Avoid excessive salt.  Increase toprol-xl to 100 mg daily. Prediabetes: Exercise target as noted below.  Dietary recommendations as noted above.  Last A1c 7.2 metformin was increased and diet is improved a little bit since check. He may be moving to [place].  His son who lives there is expecting a child.  His grandkids in this area are older and do not need as much assistance.   Current medicines are reviewed at length with the patient today.  The patient concerns regarding his medicines were addressed.  The following changes have been made:  No change  Labs/ tests ordered today include:  No orders of the defined types were placed in this encounter.   Recommend 150 minutes/week of aerobic exercise Low fat, low carb, high fiber diet recommended  Disposition:   FU in 1 year   Signed, Larae Grooms, MD  02/13/2023 9:02 AM    [facility] [address], [place], [place]  [address] Phone: [phone]; Fax: [phone]

## 2023-02-13 ENCOUNTER — Ambulatory Visit: Payer: Medicare Other | Attending: Interventional Cardiology | Admitting: Interventional Cardiology

## 2023-02-13 ENCOUNTER — Encounter: Payer: Self-pay | Admitting: Interventional Cardiology

## 2023-02-13 VITALS — BP 130/82 | HR 60 | Ht 69.0 in | Wt 165.4 lb

## 2023-02-13 DIAGNOSIS — E782 Mixed hyperlipidemia: Secondary | ICD-10-CM | POA: Diagnosis present

## 2023-02-13 DIAGNOSIS — I10 Essential (primary) hypertension: Secondary | ICD-10-CM | POA: Diagnosis present

## 2023-02-13 DIAGNOSIS — R7303 Prediabetes: Secondary | ICD-10-CM | POA: Insufficient documentation

## 2023-02-13 DIAGNOSIS — Z952 Presence of prosthetic heart valve: Secondary | ICD-10-CM | POA: Diagnosis present

## 2023-02-13 DIAGNOSIS — I480 Paroxysmal atrial fibrillation: Secondary | ICD-10-CM | POA: Diagnosis present

## 2023-02-13 MED ORDER — METOPROLOL SUCCINATE ER 100 MG PO TB24: 100.0000 mg | ORAL_TABLET | Freq: Every day | ORAL | 3 refills | Status: DC

## 2023-02-13 MED ORDER — LOSARTAN POTASSIUM 25 MG PO TABS: 25.0000 mg | ORAL_TABLET | Freq: Every day | ORAL | 3 refills | Status: DC

## 2023-02-13 MED ORDER — AMLODIPINE BESYLATE 10 MG PO TABS: 10.0000 mg | ORAL_TABLET | Freq: Every day | ORAL | 3 refills | Status: DC

## 2023-02-13 NOTE — Patient Instructions (Signed)
Medication Instructions:  Your physician has recommended you make the following change in your medication: Increase metoprolol succinate to 100 mg by mouth daily   *If you need a refill on your cardiac medications before your next appointment, please call your pharmacy*   Lab Work: none If you have labs (blood work) drawn today and your tests are completely normal, you will receive your results only by: [place] (if you have MyChart) OR A paper copy in the mail If you have any lab test that is abnormal or we need to change your treatment, we will call you to review the results.   Testing/Procedures: none   Follow-Up: At [facility], you and your health needs are our priority.  As part of our continuing mission to provide you with exceptional heart care, we have created designated Provider Care Teams.  These Care Teams include your primary Cardiologist (physician) and Advanced Practice Providers (APPs -  Physician Assistants and Nurse Practitioners) who all work together to provide you with the care you need, when you need it.  We recommend signing up for the patient portal called "MyChart".  Sign up information is provided on this After Visit Summary.  MyChart is used to connect with patients for Virtual Visits (Telemedicine).  Patients are able to view lab/test results, encounter notes, upcoming appointments, etc.  Non-urgent messages can be sent to your provider as well.   To learn more about what you can do with MyChart, go to [email or web address].    Your next appointment:   12 month(s)  Provider:   Larae Grooms, MD     Other Instructions

## 2023-02-14 NOTE — Addendum Note (Signed)
Addended by: Maryan Rued on: 02/14/2023 07:43 AM   Modules accepted: Orders

## 2023-12-10 ENCOUNTER — Other Ambulatory Visit: Payer: Self-pay | Admitting: Interventional Cardiology

## 2024-01-30 ENCOUNTER — Other Ambulatory Visit: Payer: Self-pay | Admitting: Interventional Cardiology

## 2024-02-09 ENCOUNTER — Encounter: Payer: Self-pay | Admitting: Cardiology

## 2024-02-09 ENCOUNTER — Ambulatory Visit: Payer: Medicare Other | Attending: Cardiology | Admitting: Cardiology

## 2024-02-09 DIAGNOSIS — I350 Nonrheumatic aortic (valve) stenosis: Secondary | ICD-10-CM

## 2024-02-09 DIAGNOSIS — I10 Essential (primary) hypertension: Secondary | ICD-10-CM | POA: Diagnosis not present

## 2024-02-09 DIAGNOSIS — Z952 Presence of prosthetic heart valve: Secondary | ICD-10-CM

## 2024-02-09 DIAGNOSIS — I480 Paroxysmal atrial fibrillation: Secondary | ICD-10-CM | POA: Diagnosis present

## 2024-02-09 NOTE — Patient Instructions (Signed)
 Medication Instructions:  The current medical regimen is effective;  continue present plan and medications.  *If you need a refill on your cardiac medications before your next appointment, please call your pharmacy*  Testing/Procedures: Your physician has requested that you have an echocardiogram. Echocardiography is a painless test that uses sound waves to create images of your heart. It provides your doctor with information about the size and shape of your heart and how well your heart's chambers and valves are working. This procedure takes approximately one hour. There are no restrictions for this procedure. Please do NOT wear cologne, perfume, aftershave, or lotions (deodorant is allowed). Please arrive 15 minutes prior to your appointment time.  Please note: We ask at that you not bring children with you during ultrasound (echo/ vascular) testing. Due to room size and safety concerns, children are not allowed in the ultrasound rooms during exams. Our front office staff cannot provide observation of children in our lobby area while testing is being conducted. An adult accompanying a patient to their appointment will only be allowed in the ultrasound room at the discretion of the ultrasound technician under special circumstances. We apologize for any inconvenience.   Follow-Up: At [facility], you and your health needs are our priority.  As part of our continuing mission to provide you with exceptional heart care, we have created designated Provider Care Teams.  These Care Teams include your primary Cardiologist (physician) and Advanced Practice Providers (APPs -  Physician Assistants and Nurse Practitioners) who all work together to provide you with the care you need, when you need it.  We recommend signing up for the patient portal called "MyChart".  Sign up information is provided on this After Visit Summary.  MyChart is used to connect with patients for Virtual Visits (Telemedicine).   Patients are able to view lab/test results, encounter notes, upcoming appointments, etc.  Non-urgent messages can be sent to your provider as well.   To learn more about what you can do with MyChart, go to [email or web address].    Your next appointment:   1 year(s)  Provider:   Dr Donato Schultz      1st Floor: - Lobby - Registration  - Pharmacy  - Lab - Cafe  2nd Floor: - PV Lab - Diagnostic Testing (echo, CT, nuclear med)  3rd Floor: - Vacant  4th Floor: - TCTS (cardiothoracic surgery) - AFib Clinic - Structural Heart Clinic - Vascular Surgery  - Vascular Ultrasound  5th Floor: - HeartCare Cardiology (general and EP) - Clinical Pharmacy for coumadin, hypertension, lipid, weight-loss medications, and med management appointments    Valet parking services will be available as well.

## 2024-02-09 NOTE — Progress Notes (Signed)
 Cardiology Office Note:  .   Date:  02/09/2024  ID:  Ronald Gomez, Ronald Gomez, DOB [birth year], MRN [id number] PCP: [name], Ronald Gomez  [facility] Providers Cardiologist:  Donato Schultz, Ronald Gomez     History of Present Illness: .   Ronald Gomez, Ronald Gomez is a 73 y.o. male Discussed the use of AI scribe software for clinical note transcription with the patient, who gave verbal consent to proceed.  History of Present Illness Dr. Gypsy Gomez, Ronald Gomez "[name]" is a 73 year old male with a history of aortic valve replacement who presents for follow-up.  He is seven years post-aortic valve replacement with a 23 mm [facility] Ease pericardial valve. His postoperative course was complicated by atrial fibrillation, which was converted to normal sinus rhythm at discharge. He experienced some atrial fibrillation in the postoperative period but currently has no significant issues with the valve. He had an episode of shortness of breath while hiking in [place], attributed to high altitude. He regularly walks 3.5 to 5 miles a day without issues. He takes aspirin 81 mg daily for valve maintenance.  He has a history of diabetes with an elevated hemoglobin A1c of 8.9. His blood sugars are high in the morning, with fasting sugars around 150, despite dietary modifications. He started Jardiance 10 mg daily about three weeks ago, which has helped improve his blood sugars. He also takes metformin 1500 mg daily.  He has a history of hyperlipidemia and previously had trouble tolerating atorvastatin. He is currently taking rosuvastatin 5 mg daily. His LDL is 54, and triglycerides are 240, which he attributes to his elevated A1c.  He is on losartan 25 mg daily, amlodipine 10 mg daily, metoprolol 100 mg daily, and Lasix 20 mg daily for blood pressure management. Blood pressure appears to be well-controlled with this regimen.  He is a retired Designer, industrial/product with ties to [place] and is planning to move back to [place] to be  closer to family. In 2019, he experienced a fall while putting up Christmas decorations, resulting in a torn left quadricep, which required surgery.      ROS: No CP.   Studies Reviewed: Marland Kitchen   EKG Interpretation Date/Time:  Monday February 09 2024 08:57:48 EDT Ventricular Rate:  65 PR Interval:  200 QRS Duration:  98 QT Interval:  418 QTC Calculation: 434 R Axis:   64  Text Interpretation: Normal sinus rhythm Normal ECG When compared with ECG of 30-Aug-2016 07:19, ST no longer elevated in Anterolateral leads T wave amplitude has decreased in Anterior leads Nonspecific T wave abnormality, improved in Lateral leads Confirmed by Donato Schultz ([id number]) on 02/09/2024 9:13:16 AM    Results LABS Hemoglobin A1c: 8.9% LDL: 54 mg/dL Triglycerides: 086 mg/dL Creatinine: 1.1 mg/dL Hemoglobin: 57.8 g/dL TSH: 2.7 IU/mL Risk Assessment/Calculations:            Physical Exam:   VS:  BP 120/76   Pulse 65   Ht 5\' 9"  (1.753 m)   Wt 167 lb 6.4 oz (75.9 kg)   SpO2 99%   BMI 24.72 kg/m    Wt Readings from Last 3 Encounters:  02/09/24 167 lb 6.4 oz (75.9 kg)  02/13/23 165 lb 6.4 oz (75 kg)  12/10/21 170 lb (77.1 kg)    GEN: Well nourished, well developed in no acute distress NECK: No JVD; No carotid bruits CARDIAC: RRR, 1/6 SM, no rubs, no gallops RESPIRATORY:  Clear to auscultation without rales, wheezing or rhonchi  ABDOMEN: Soft, non-tender, non-distended  EXTREMITIES:  No edema; No deformity   ASSESSMENT AND PLAN: .    Assessment and Plan Assessment & Plan Aortic valve replacement follow-up Seven years post aortic valve replacement with a 23 mm [facility] Ease pericardial valve. Reports no significant issues with the valve, except for an episode of shortness of breath during a high-altitude hike in [place], considered an extreme situation. The valve appears to be functioning well, and there is no audible murmur during the examination. An echocardiogram is planned to assess the  stability of the valve, given the time elapsed since the last evaluation. If intervention is needed, a TAVR valve-in-valve procedure is a potential option. - Order echocardiogram to assess valve stability  Atrial fibrillation Post-operative atrial fibrillation after aortic valve replacement, converted to normal sinus rhythm at discharge. No current evidence of atrial fibrillation.  Hypertension On a regimen of losartan 25 mg daily, amlodipine 10 mg daily, metoprolol 100 mg daily, and Lasix 20 mg. Blood pressure is well-controlled. - Continue current antihypertensive regimen  Type 2 diabetes mellitus Hemoglobin A1c elevated at 8.9, indicating suboptimal control. Reports high fasting blood sugars in the morning, despite dietary modifications. Recently started on Jardiance 10 mg daily, which has improved blood sugar levels. Agrees with the plan to lower A1c and anticipates triglycerides will decrease as blood sugar control improves. Discussed cost of Jardiance and potential alternatives like metformin, which he is already taking at 1500 mg daily. - Continue Jardiance 10 mg daily - Monitor blood sugar levels - Encourage dietary modifications to improve glycemic control  Hyperlipidemia Intolerance to atorvastatin, currently on rosuvastatin 5 mg daily. LDL well-controlled at 54, but triglycerides elevated at 240, likely related to poor glycemic control. Anticipates triglycerides will improve with better diabetes management. - Continue rosuvastatin 5 mg daily  Follow-up Planning to move to [facility] and may not return for follow-up. Advised to find a good internist in [place] for continued care. Will provide a copy of his records for the transition. - Schedule follow-up in one year if still in the area - Provide copy of medical records for transition to new care provider          Signed, Donato Schultz, Ronald Gomez

## 2024-03-07 ENCOUNTER — Other Ambulatory Visit: Payer: Self-pay | Admitting: Cardiology

## 2024-03-08 ENCOUNTER — Encounter: Payer: Self-pay | Admitting: Cardiology

## 2024-03-08 ENCOUNTER — Ambulatory Visit (HOSPITAL_COMMUNITY): Attending: Internal Medicine

## 2024-03-08 DIAGNOSIS — I350 Nonrheumatic aortic (valve) stenosis: Secondary | ICD-10-CM | POA: Insufficient documentation

## 2024-03-08 DIAGNOSIS — Z952 Presence of prosthetic heart valve: Secondary | ICD-10-CM | POA: Diagnosis not present

## 2024-03-08 LAB — ECHOCARDIOGRAM COMPLETE
AR max vel: 1.85 cm2
AV Area VTI: 1.9 cm2
AV Area mean vel: 1.79 cm2
AV Mean grad: 11.2 mmHg
AV Peak grad: 20.3 mmHg
Ao pk vel: 2.25 m/s
Area-P 1/2: 2.93 cm2
S' Lateral: 2.9 cm

## 2024-03-11 ENCOUNTER — Other Ambulatory Visit: Payer: Self-pay | Admitting: Cardiology

## 2024-04-25 ENCOUNTER — Other Ambulatory Visit: Payer: Self-pay | Admitting: Cardiology
